# Patient Record
Sex: Female | Born: 1935 | Race: Black or African American | Hispanic: No | Marital: Married | Smoking: Former smoker
Health system: Southern US, Community
[De-identification: ages and names within clinical notes are randomized; demographics above are authoritative.]

## PROBLEM LIST (undated history)

## (undated) DIAGNOSIS — N390 Urinary tract infection, site not specified: Secondary | ICD-10-CM

## (undated) DIAGNOSIS — G319 Degenerative disease of nervous system, unspecified: Secondary | ICD-10-CM

## (undated) DIAGNOSIS — Z9181 History of falling: Secondary | ICD-10-CM

## (undated) DIAGNOSIS — R338 Other retention of urine: Secondary | ICD-10-CM

## (undated) DIAGNOSIS — M51369 Other intervertebral disc degeneration, lumbar region without mention of lumbar back pain or lower extremity pain: Secondary | ICD-10-CM

## (undated) DIAGNOSIS — M1120 Other chondrocalcinosis, unspecified site: Secondary | ICD-10-CM

## (undated) DIAGNOSIS — F0280 Dementia in other diseases classified elsewhere without behavioral disturbance: Secondary | ICD-10-CM

## (undated) DIAGNOSIS — E785 Hyperlipidemia, unspecified: Secondary | ICD-10-CM

## (undated) DIAGNOSIS — J309 Allergic rhinitis, unspecified: Secondary | ICD-10-CM

## (undated) DIAGNOSIS — M1991 Primary osteoarthritis, unspecified site: Secondary | ICD-10-CM

## (undated) DIAGNOSIS — R809 Proteinuria, unspecified: Secondary | ICD-10-CM

## (undated) DIAGNOSIS — Z2821 Immunization not carried out because of patient refusal: Secondary | ICD-10-CM

## (undated) DIAGNOSIS — M1990 Unspecified osteoarthritis, unspecified site: Secondary | ICD-10-CM

## (undated) DIAGNOSIS — Z86718 Personal history of other venous thrombosis and embolism: Secondary | ICD-10-CM

## (undated) DIAGNOSIS — I10 Essential (primary) hypertension: Secondary | ICD-10-CM

## (undated) DIAGNOSIS — G119 Hereditary ataxia, unspecified: Secondary | ICD-10-CM

## (undated) DIAGNOSIS — K5909 Other constipation: Secondary | ICD-10-CM

## (undated) DIAGNOSIS — K5730 Diverticulosis of large intestine without perforation or abscess without bleeding: Secondary | ICD-10-CM

## (undated) DIAGNOSIS — R29898 Other symptoms and signs involving the musculoskeletal system: Secondary | ICD-10-CM

## (undated) DIAGNOSIS — Z7901 Long term (current) use of anticoagulants: Secondary | ICD-10-CM

## (undated) DIAGNOSIS — F0150 Vascular dementia without behavioral disturbance: Secondary | ICD-10-CM

## (undated) DIAGNOSIS — E7800 Pure hypercholesterolemia, unspecified: Secondary | ICD-10-CM

## (undated) DIAGNOSIS — I739 Peripheral vascular disease, unspecified: Secondary | ICD-10-CM

## (undated) DIAGNOSIS — D509 Iron deficiency anemia, unspecified: Secondary | ICD-10-CM

## (undated) DIAGNOSIS — R4182 Altered mental status, unspecified: Secondary | ICD-10-CM

## (undated) DIAGNOSIS — K117 Disturbances of salivary secretion: Secondary | ICD-10-CM

## (undated) DIAGNOSIS — I952 Hypotension due to drugs: Secondary | ICD-10-CM

## (undated) DIAGNOSIS — M542 Cervicalgia: Secondary | ICD-10-CM

## (undated) DIAGNOSIS — I6529 Occlusion and stenosis of unspecified carotid artery: Secondary | ICD-10-CM

## (undated) DIAGNOSIS — E876 Hypokalemia: Secondary | ICD-10-CM

## (undated) DIAGNOSIS — K219 Gastro-esophageal reflux disease without esophagitis: Secondary | ICD-10-CM

## (undated) DIAGNOSIS — Z7409 Other reduced mobility: Secondary | ICD-10-CM

## (undated) DIAGNOSIS — R600 Localized edema: Secondary | ICD-10-CM

## (undated) DIAGNOSIS — N319 Neuromuscular dysfunction of bladder, unspecified: Secondary | ICD-10-CM

## (undated) DIAGNOSIS — R630 Anorexia: Secondary | ICD-10-CM

## (undated) DIAGNOSIS — R109 Unspecified abdominal pain: Secondary | ICD-10-CM

## (undated) DIAGNOSIS — M48061 Spinal stenosis, lumbar region without neurogenic claudication: Secondary | ICD-10-CM

## (undated) DIAGNOSIS — M5136 Other intervertebral disc degeneration, lumbar region: Secondary | ICD-10-CM

## (undated) DIAGNOSIS — R1319 Other dysphagia: Secondary | ICD-10-CM

## (undated) DIAGNOSIS — M109 Gout, unspecified: Secondary | ICD-10-CM

## (undated) DIAGNOSIS — R490 Dysphonia: Secondary | ICD-10-CM

## (undated) HISTORY — DX: History of falling: Z91.81

## (undated) HISTORY — DX: Other intervertebral disc degeneration, lumbar region: M51.36

## (undated) HISTORY — DX: Anorexia: R63.0

## (undated) HISTORY — DX: Other intervertebral disc degeneration, lumbar region without mention of lumbar back pain or lower extremity pain: M51.369

## (undated) HISTORY — DX: Other symptoms and signs involving the musculoskeletal system: R29.898

## (undated) HISTORY — DX: Proteinuria, unspecified: R80.9

## (undated) HISTORY — DX: Other retention of urine: R33.8

## (undated) HISTORY — DX: Neuromuscular dysfunction of bladder, unspecified: N31.9

## (undated) HISTORY — DX: Occlusion and stenosis of unspecified carotid artery: I65.29

## (undated) HISTORY — DX: Gout, unspecified: M10.9

## (undated) HISTORY — DX: Other chondrocalcinosis, unspecified site: M11.20

## (undated) HISTORY — DX: Hypotension due to drugs: I95.2

## (undated) HISTORY — DX: Personal history of other venous thrombosis and embolism: Z86.718

## (undated) HISTORY — DX: Unspecified osteoarthritis, unspecified site: M19.90

## (undated) HISTORY — DX: Vascular dementia without behavioral disturbance: F01.50

## (undated) HISTORY — DX: Peripheral vascular disease, unspecified: I73.9

## (undated) HISTORY — DX: Hereditary ataxia, unspecified: G11.9

## (undated) HISTORY — DX: Unspecified abdominal pain: R10.9

## (undated) HISTORY — DX: Cervicalgia: M54.2

## (undated) HISTORY — DX: Hypokalemia: E87.6

## (undated) HISTORY — DX: Essential (primary) hypertension: I10

## (undated) HISTORY — DX: Dysphonia: R49.0

## (undated) HISTORY — DX: Other constipation: K59.09

## (undated) HISTORY — DX: Disturbances of salivary secretion: K11.7

## (undated) HISTORY — DX: Altered mental status, unspecified: R41.82

## (undated) HISTORY — DX: Long term (current) use of anticoagulants: Z79.01

## (undated) HISTORY — DX: Allergic rhinitis, unspecified: J30.9

## (undated) HISTORY — DX: Diverticulosis of large intestine without perforation or abscess without bleeding: K57.30

## (undated) HISTORY — DX: Immunization not carried out because of patient refusal: Z28.21

## (undated) HISTORY — DX: Gastro-esophageal reflux disease without esophagitis: K21.9

## (undated) HISTORY — DX: Degenerative disease of nervous system, unspecified: G31.9

## (undated) HISTORY — DX: Vascular dementia, unspecified severity, without behavioral disturbance, psychotic disturbance, mood disturbance, and anxiety: F02.80

## (undated) HISTORY — DX: Iron deficiency anemia, unspecified: D50.9

## (undated) HISTORY — DX: Other reduced mobility: Z74.09

## (undated) HISTORY — DX: Urinary tract infection, site not specified: N39.0

## (undated) HISTORY — DX: Localized edema: R60.0

## (undated) HISTORY — DX: Primary osteoarthritis, unspecified site: M19.91

## (undated) HISTORY — DX: Other dysphagia: R13.19

## (undated) HISTORY — DX: Spinal stenosis, lumbar region without neurogenic claudication: M48.061

## (undated) HISTORY — DX: Hyperlipidemia, unspecified: E78.5

---

## 1982-05-08 DIAGNOSIS — Z87891 Personal history of nicotine dependence: Secondary | ICD-10-CM

## 1982-05-08 HISTORY — DX: Personal history of nicotine dependence: Z87.891

## 2012-10-12 DIAGNOSIS — N319 Neuromuscular dysfunction of bladder, unspecified: Secondary | ICD-10-CM | POA: Insufficient documentation

## 2012-10-12 DIAGNOSIS — M48061 Spinal stenosis, lumbar region without neurogenic claudication: Secondary | ICD-10-CM | POA: Insufficient documentation

## 2012-10-12 DIAGNOSIS — K219 Gastro-esophageal reflux disease without esophagitis: Secondary | ICD-10-CM | POA: Insufficient documentation

## 2012-10-12 DIAGNOSIS — M5137 Other intervertebral disc degeneration, lumbosacral region: Secondary | ICD-10-CM | POA: Insufficient documentation

## 2013-05-08 DIAGNOSIS — I2510 Atherosclerotic heart disease of native coronary artery without angina pectoris: Secondary | ICD-10-CM

## 2013-05-08 DIAGNOSIS — N183 Chronic kidney disease, stage 3 unspecified: Secondary | ICD-10-CM

## 2013-05-08 HISTORY — DX: Atherosclerotic heart disease of native coronary artery without angina pectoris: I25.10

## 2013-05-08 HISTORY — DX: Chronic kidney disease, stage 3 unspecified: N18.30

## 2013-09-04 DIAGNOSIS — K5730 Diverticulosis of large intestine without perforation or abscess without bleeding: Secondary | ICD-10-CM | POA: Insufficient documentation

## 2013-09-05 DIAGNOSIS — E785 Hyperlipidemia, unspecified: Secondary | ICD-10-CM | POA: Insufficient documentation

## 2013-12-01 DIAGNOSIS — I2510 Atherosclerotic heart disease of native coronary artery without angina pectoris: Secondary | ICD-10-CM | POA: Insufficient documentation

## 2013-12-01 DIAGNOSIS — M1990 Unspecified osteoarthritis, unspecified site: Secondary | ICD-10-CM | POA: Insufficient documentation

## 2013-12-01 DIAGNOSIS — M109 Gout, unspecified: Secondary | ICD-10-CM | POA: Insufficient documentation

## 2014-05-08 DIAGNOSIS — R471 Dysarthria and anarthria: Secondary | ICD-10-CM

## 2014-05-08 HISTORY — DX: Dysarthria and anarthria: R47.1

## 2014-07-18 ENCOUNTER — Emergency Department (HOSPITAL_BASED_OUTPATIENT_CLINIC_OR_DEPARTMENT_OTHER): Payer: Medicare Other

## 2014-07-18 ENCOUNTER — Emergency Department (HOSPITAL_BASED_OUTPATIENT_CLINIC_OR_DEPARTMENT_OTHER)
Admission: EM | Admit: 2014-07-18 | Discharge: 2014-07-18 | Disposition: A | Discharge status: Home / Self Care | Payer: Medicare Other | Attending: Emergency Medicine | Admitting: Emergency Medicine

## 2014-07-18 ENCOUNTER — Encounter (HOSPITAL_BASED_OUTPATIENT_CLINIC_OR_DEPARTMENT_OTHER): Payer: Self-pay

## 2014-07-18 DIAGNOSIS — S0083XA Contusion of other part of head, initial encounter: Secondary | ICD-10-CM | POA: Insufficient documentation

## 2014-07-18 DIAGNOSIS — W19XXXA Unspecified fall, initial encounter: Secondary | ICD-10-CM

## 2014-07-18 DIAGNOSIS — Z7901 Long term (current) use of anticoagulants: Secondary | ICD-10-CM | POA: Diagnosis not present

## 2014-07-18 DIAGNOSIS — Y9289 Other specified places as the place of occurrence of the external cause: Secondary | ICD-10-CM | POA: Diagnosis not present

## 2014-07-18 DIAGNOSIS — E780 Pure hypercholesterolemia: Secondary | ICD-10-CM | POA: Diagnosis not present

## 2014-07-18 DIAGNOSIS — T148XXA Other injury of unspecified body region, initial encounter: Secondary | ICD-10-CM

## 2014-07-18 DIAGNOSIS — S139XXA Sprain of joints and ligaments of unspecified parts of neck, initial encounter: Secondary | ICD-10-CM

## 2014-07-18 DIAGNOSIS — W01198A Fall on same level from slipping, tripping and stumbling with subsequent striking against other object, initial encounter: Secondary | ICD-10-CM | POA: Insufficient documentation

## 2014-07-18 DIAGNOSIS — S0990XA Unspecified injury of head, initial encounter: Secondary | ICD-10-CM | POA: Diagnosis present

## 2014-07-18 DIAGNOSIS — Z79899 Other long term (current) drug therapy: Secondary | ICD-10-CM | POA: Diagnosis not present

## 2014-07-18 DIAGNOSIS — I10 Essential (primary) hypertension: Secondary | ICD-10-CM | POA: Insufficient documentation

## 2014-07-18 DIAGNOSIS — Y998 Other external cause status: Secondary | ICD-10-CM | POA: Diagnosis not present

## 2014-07-18 DIAGNOSIS — S134XXA Sprain of ligaments of cervical spine, initial encounter: Secondary | ICD-10-CM | POA: Diagnosis not present

## 2014-07-18 DIAGNOSIS — Y9389 Activity, other specified: Secondary | ICD-10-CM | POA: Insufficient documentation

## 2014-07-18 HISTORY — DX: Essential (primary) hypertension: I10

## 2014-07-18 HISTORY — DX: Pure hypercholesterolemia, unspecified: E78.00

## 2014-07-18 MED ORDER — TRAMADOL HCL 50 MG PO TABS: 50.0000 mg | ORAL_TABLET | Freq: Four times a day (QID) | ORAL | Status: DC | PRN

## 2014-07-18 NOTE — ED Provider Notes (Signed)
CSN: [id number]     Arrival date & time 07/18/14  4098 History   First MD Initiated Contact with Patient 07/18/14 1024     Chief Complaint  Patient presents with  . Fall     (Consider location/radiation/quality/duration/timing/severity/associated sxs/prior Treatment) HPI Comments: PT comes in with cc of fall. Pt is on coumadin and has poor balance, she is seeing a neurologist.  Pt fell yday, getting out of her car and hit her head on the car door. She has been ambulating since then without pain. Pt has no mild pain in the back of her head and her neck, especially when she turns or lifts her head. No numbness, tingling. No hx of neck surgeries.   Patient is a 81 y.o. female presenting with fall. The history is provided by the patient.  Fall Associated symptoms include headaches.    Past Medical History  Diagnosis Date  . Hypertension   . High cholesterol    History reviewed. No pertinent past surgical history. No family history on file. History  Substance Use Topics  . Smoking status: Never Smoker   . Smokeless tobacco: Not on file  . Alcohol Use: Not on file   OB History    No data available     Review of Systems  Constitutional: Negative for activity change.  Musculoskeletal: Positive for neck pain.  Neurological: Positive for headaches. Negative for seizures, weakness and numbness.  Hematological: Bruises/bleeds easily.      Allergies  Review of patient's allergies indicates no known allergies.  Home Medications   Prior to Admission medications   Medication Sig Start Date End Date Taking? Authorizing Provider  atorvastatin (LIPITOR) 80 MG tablet Take 80 mg by mouth daily.   Yes Historical Provider, MD  benazepril (LOTENSIN) 40 MG tablet Take 40 mg by mouth daily.   Yes Historical Provider, MD  metoprolol tartrate (LOPRESSOR) 25 MG tablet Take 25 mg by mouth 2 (two) times daily.   Yes Historical Provider, MD  ranitidine (ZANTAC) 150 MG capsule Take 150 mg by  mouth 2 (two) times daily.   Yes Historical Provider, MD  traMADol (ULTRAM) 50 MG tablet Take 1 tablet (50 mg total) by mouth every 6 (six) hours as needed. 07/18/14   Derwood Kaplan, MD  warfarin (COUMADIN) 2 MG tablet Take 2 mg by mouth daily.   Yes Historical Provider, MD   BP 140/88 mmHg  Pulse 86  Temp(Src) 97.6 F (36.4 C) (Oral)  Resp 20  SpO2 96% Physical Exam  Constitutional: She is oriented to person, place, and time. She appears well-developed and well-nourished.  HENT:  Head: Normocephalic and atraumatic.  Hematoma on the occiput  Eyes: EOM are normal. Pupils are equal, round, and reactive to light.  Neck: Neck supple.  + generalized c-spine tenderness  Cardiovascular: Normal rate, regular rhythm and normal heart sounds.   No murmur heard. Pulmonary/Chest: Effort normal and breath sounds normal. No respiratory distress. She exhibits no tenderness.  Abdominal: Soft. Bowel sounds are normal. She exhibits no distension. There is no tenderness. There is no rebound and no guarding.  Musculoskeletal:  No long bone tenderness - upper and lower extrmeities and no pelvic pain, instability.  Neurological: She is alert and oriented to person, place, and time. No cranial nerve deficit.  Skin: Skin is warm and dry. No rash noted.  Nursing note and vitals reviewed.   ED Course  Procedures (including critical care time) Labs Review Labs Reviewed - No data to display  Imaging  Review Ct Head Wo Contrast  07/18/2014   CLINICAL DATA:  Fall.  Hit occipital region.  Headache.  EXAM: CT HEAD WITHOUT CONTRAST  TECHNIQUE: Contiguous axial images were obtained from the base of the skull through the vertex without intravenous contrast.  COMPARISON:  10/02/2008  FINDINGS: Prominence of the sulci and ventricles identified consistent with brain atrophy. Low-attenuation within the periventricular and subcortical white matter noted consistent with mild chronic microvascular disease. The midline is  maintained. No edema or mass effect identified. The paranasal sinuses are clear. The mastoid air cells appear clear. The calvarium is intact.  IMPRESSION: 1. No acute intracranial abnormalities. 2. Small vessel ischemic disease and brain atrophy.   Electronically Signed   By: Signa Kellaylor  Stroud M.D.   On: 07/18/2014 10:29   Ct Cervical Spine Wo Contrast  07/18/2014   CLINICAL DATA:  Fall yesterday.  Posterior neck pain  EXAM: CT CERVICAL SPINE WITHOUT CONTRAST  TECHNIQUE: Multidetector CT imaging of the cervical spine was performed without intravenous contrast. Multiplanar CT image reconstructions were also generated.  COMPARISON:  None.  FINDINGS: Normal alignment of the cervical spine. The vertebral body heights are well preserved. No evidence for cervical spine fracture. The facet joints appear aligned. Moderate to marked multi level disc space narrowing and ventral endplate spurring extends from C2 through C7.  IMPRESSION: 1. Cervical spondylosis. 2. No acute findings.   Electronically Signed   By: Signa Kellaylor  Stroud M.D.   On: 07/18/2014 12:45     EKG Interpretation None      MDM   Final diagnoses:  Cervical sprain, initial encounter  Fall, initial encounter  Hematoma    DDx includes: - Mechanical falls - ICH - Fractures - Contusions - Soft tissue injury  Pt with fall. CT head and CT c-spine ordered due to her having mild headache ,neck pain and being on coumadin with a hematoma.     Derwood KaplanAnkit Dyana Magner, MD 07/18/14 1329

## 2014-07-18 NOTE — Discharge Instructions (Signed)
We saw you in the ER after you had a fall. All the imaging results are normal, no fractures seen. No evidence of brain bleed. Please be very careful with walking, and do everything possible to prevent falls.   Cervical Sprain A cervical sprain is an injury in the neck in which the strong, fibrous tissues (ligaments) that connect your neck bones stretch or tear. Cervical sprains can range from mild to severe. Severe cervical sprains can cause the neck vertebrae to be unstable. This can lead to damage of the spinal cord and can result in serious nervous system problems. The amount of time it takes for a cervical sprain to get better depends on the cause and extent of the injury. Most cervical sprains heal in 1 to 3 weeks. CAUSES  Severe cervical sprains may be caused by:   Contact sport injuries (such as from football, rugby, wrestling, hockey, auto racing, gymnastics, diving, martial arts, or boxing).   Motor vehicle collisions.   Whiplash injuries. This is an injury from a sudden forward and backward whipping movement of the head and neck.  Falls.  Mild cervical sprains may be caused by:   Being in an awkward position, such as while cradling a telephone between your ear and shoulder.   Sitting in a chair that does not offer proper support.   Working at a poorly Marketing executivedesigned computer station.   Looking up or down for long periods of time.  SYMPTOMS   Pain, soreness, stiffness, or a burning sensation in the front, back, or sides of the neck. This discomfort may develop immediately after the injury or slowly, 24 hours or more after the injury.   Pain or tenderness directly in the middle of the back of the neck.   Shoulder or upper back pain.   Limited ability to move the neck.   Headache.   Dizziness.   Weakness, numbness, or tingling in the hands or arms.   Muscle spasms.   Difficulty swallowing or chewing.   Tenderness and swelling of the neck.  DIAGNOSIS    Most of the time your health care provider can diagnose a cervical sprain by taking your history and doing a physical exam. Your health care provider will ask about previous neck injuries and any known neck problems, such as arthritis in the neck. X-rays may be taken to find out if there are any other problems, such as with the bones of the neck. Other tests, such as a CT scan or MRI, may also be needed.  TREATMENT  Treatment depends on the severity of the cervical sprain. Mild sprains can be treated with rest, keeping the neck in place (immobilization), and pain medicines. Severe cervical sprains are immediately immobilized. Further treatment is done to help with pain, muscle spasms, and other symptoms and may include:  Medicines, such as pain relievers, numbing medicines, or muscle relaxants.   Physical therapy. This may involve stretching exercises, strengthening exercises, and posture training. Exercises and improved posture can help stabilize the neck, strengthen muscles, and help stop symptoms from returning.  HOME CARE INSTRUCTIONS   Put ice on the injured area.   Put ice in a plastic bag.   Place a towel between your skin and the bag.   Leave the ice on for 15-20 minutes, 3-4 times a day.   If your injury was severe, you may have been given a cervical collar to wear. A cervical collar is a two-piece collar designed to keep your neck from moving while  it heals.  Do not remove the collar unless instructed by your health care provider.  If you have long hair, keep it outside of the collar.  Ask your health care provider before making any adjustments to your collar. Minor adjustments may be required over time to improve comfort and reduce pressure on your chin or on the back of your head.  Ifyou are allowed to remove the collar for cleaning or bathing, follow your health care provider's instructions on how to do so safely.  Keep your collar clean by wiping it with mild soap  and water and drying it completely. If the collar you have been given includes removable pads, remove them every 1-2 days and hand wash them with soap and water. Allow them to air dry. They should be completely dry before you wear them in the collar.  If you are allowed to remove the collar for cleaning and bathing, wash and dry the skin of your neck. Check your skin for irritation or sores. If you see any, tell your health care provider.  Do not drive while wearing the collar.   Only take over-the-counter or prescription medicines for pain, discomfort, or fever as directed by your health care provider.   Keep all follow-up appointments as directed by your health care provider.   Keep all physical therapy appointments as directed by your health care provider.   Make any needed adjustments to your workstation to promote good posture.   Avoid positions and activities that make your symptoms worse.   Warm up and stretch before being active to help prevent problems.  SEEK MEDICAL CARE IF:   Your pain is not controlled with medicine.   You are unable to decrease your pain medicine over time as planned.   Your activity level is not improving as expected.  SEEK IMMEDIATE MEDICAL CARE IF:   You develop any bleeding.  You develop stomach upset.  You have signs of an allergic reaction to your medicine.   Your symptoms get worse.   You develop new, unexplained symptoms.   You have numbness, tingling, weakness, or paralysis in any part of your body.  MAKE SURE YOU:   Understand these instructions.  Will watch your condition.  Will get help right away if you are not doing well or get worse. Document Released: 02/19/2007 Document Revised: 04/29/2013 Document Reviewed: 10/30/2012 [facility] Patient Information 2015 [place], [place]. This information is not intended to replace advice given to you by your health care provider. Make sure you discuss any questions you have with  your health care provider.  Contusion A contusion is a deep bruise. Contusions happen when an injury causes bleeding under the skin. Signs of bruising include pain, puffiness (swelling), and discolored skin. The contusion may turn blue, purple, or yellow. HOME CARE   Put ice on the injured area.  Put ice in a plastic bag.  Place a towel between your skin and the bag.  Leave the ice on for 15-20 minutes, 03-04 times a day.  Only take medicine as told by your doctor.  Rest the injured area.  If possible, raise (elevate) the injured area to lessen puffiness. GET HELP RIGHT AWAY IF:   You have more bruising or puffiness.  You have pain that is getting worse.  Your puffiness or pain is not helped by medicine. MAKE SURE YOU:   Understand these instructions.  Will watch your condition.  Will get help right away if you are not doing well or get  worse. Document Released: 10/11/2007 Document Revised: 07/17/2011 Document Reviewed: 02/27/2011 [facility] Patient Information 2015 [place], [place]. This information is not intended to replace advice given to you by your health care provider. Make sure you discuss any questions you have with your health care provider.

## 2014-07-18 NOTE — ED Notes (Signed)
Patient here after falling against car door yesterday and hitting back of head against door. Then slid to ground and falling onto cement. No loc, no nausea, no blurred vision.  Alert and oriented, small hematoma to back of head.

## 2014-09-08 DIAGNOSIS — Z2821 Immunization not carried out because of patient refusal: Secondary | ICD-10-CM | POA: Insufficient documentation

## 2014-11-16 DIAGNOSIS — G119 Hereditary ataxia, unspecified: Secondary | ICD-10-CM | POA: Insufficient documentation

## 2015-02-17 DIAGNOSIS — R471 Dysarthria and anarthria: Secondary | ICD-10-CM | POA: Insufficient documentation

## 2015-05-09 DIAGNOSIS — Z7901 Long term (current) use of anticoagulants: Secondary | ICD-10-CM

## 2015-05-09 DIAGNOSIS — R791 Abnormal coagulation profile: Secondary | ICD-10-CM

## 2015-05-09 DIAGNOSIS — R7303 Prediabetes: Secondary | ICD-10-CM

## 2015-05-09 DIAGNOSIS — I6523 Occlusion and stenosis of bilateral carotid arteries: Secondary | ICD-10-CM

## 2015-05-09 DIAGNOSIS — Z5181 Encounter for therapeutic drug level monitoring: Secondary | ICD-10-CM

## 2015-05-09 HISTORY — DX: Long term (current) use of anticoagulants: Z79.01

## 2015-05-09 HISTORY — DX: Occlusion and stenosis of bilateral carotid arteries: I65.23

## 2015-05-09 HISTORY — DX: Prediabetes: R73.03

## 2015-05-09 HISTORY — DX: Encounter for therapeutic drug level monitoring: Z51.81

## 2015-05-09 HISTORY — DX: Abnormal coagulation profile: R79.1

## 2015-08-11 IMAGING — CT CT CERVICAL SPINE W/O CM
4 series · 15 of 33 positions shown, 18 images · non-contrast
Comparison: None.

CLINICAL DATA: Fall yesterday.  Posterior neck pain

EXAM:
CT CERVICAL SPINE WITHOUT CONTRAST
TECHNIQUE: Multidetector CT imaging of the cervical spine was performed without
intravenous contrast. Multiplanar CT image reconstructions were also
generated.

[Series 3: c_spine 2.0 b41s st · axial · 0.31mm/px · z∈[-179,-75]mm · 5 of 78 slices shown, 7 images]
[im 13/78  soft-tissue]
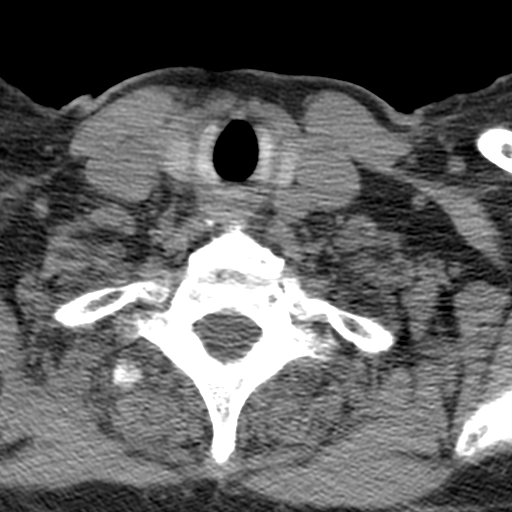
[im 13/78  bone]
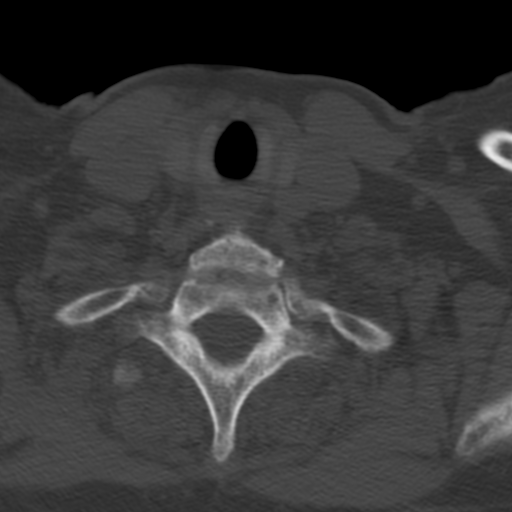
[im 26/78  bone]
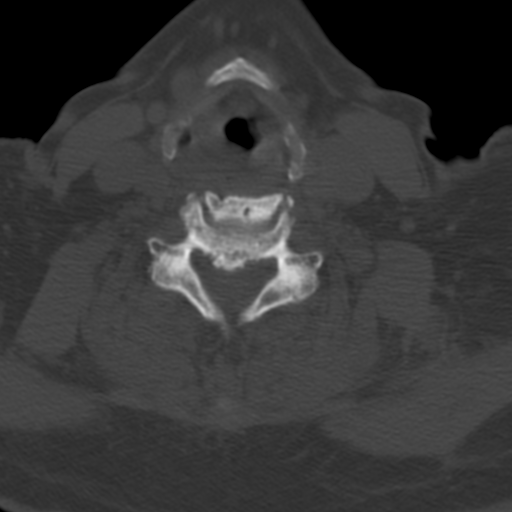
[im 39/78  bone]
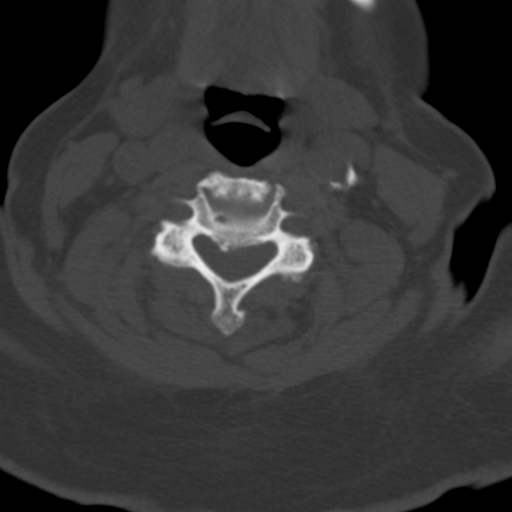
[im 52/78  bone]
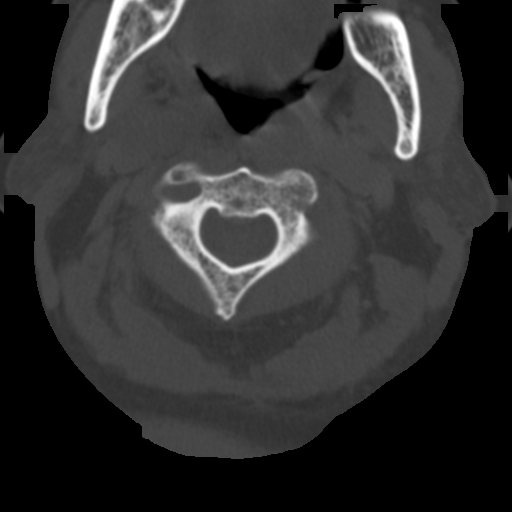
[im 65/78  soft-tissue]
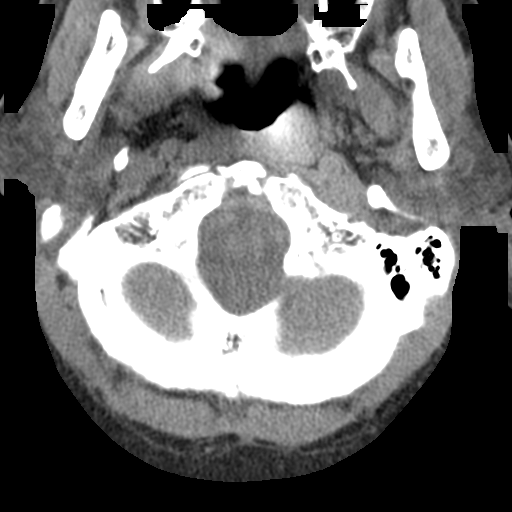
[im 65/78  bone]
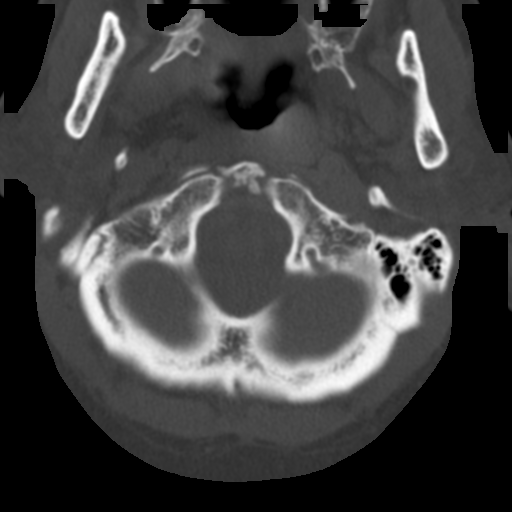

[Series 6: c_spine 2.0 coronal · coronal · 0.31mm/px · 3 of 45 slices shown]
[im 9/45  bone]
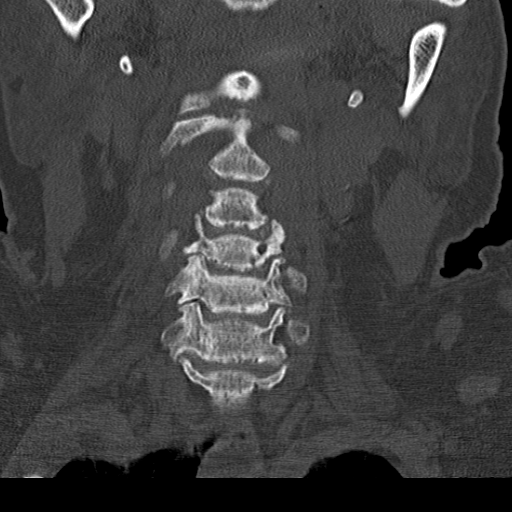
[im 18/45  bone]
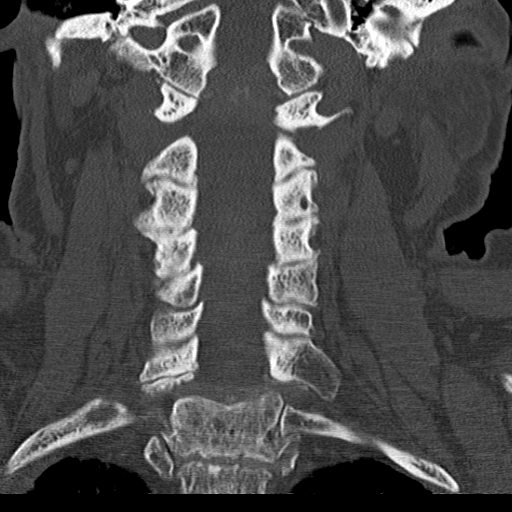
[im 27/45  bone]
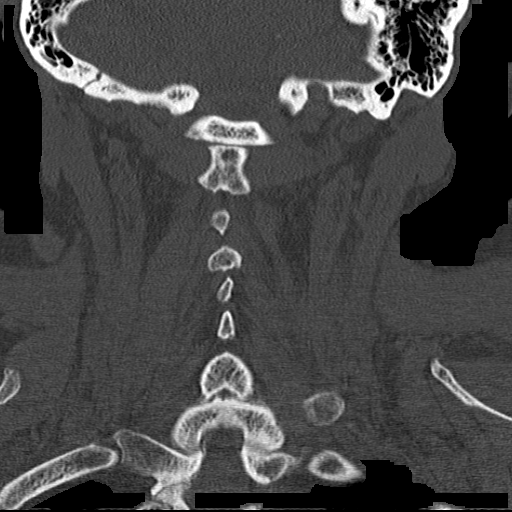

[Series 7: c_spine 2.0 sagittal · sagittal · 0.31mm/px · 5 of 38 slices shown, 6 images]
[im 13/38  bone]
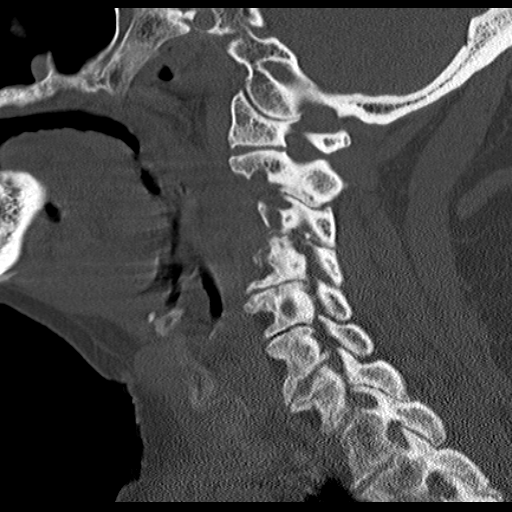
[im 16/38  bone]
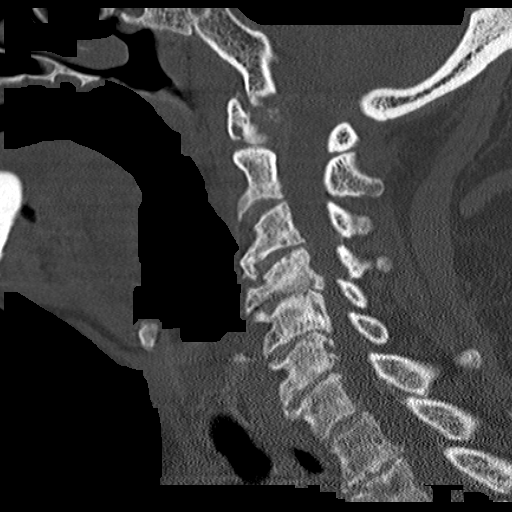
[im 19/38  soft-tissue]
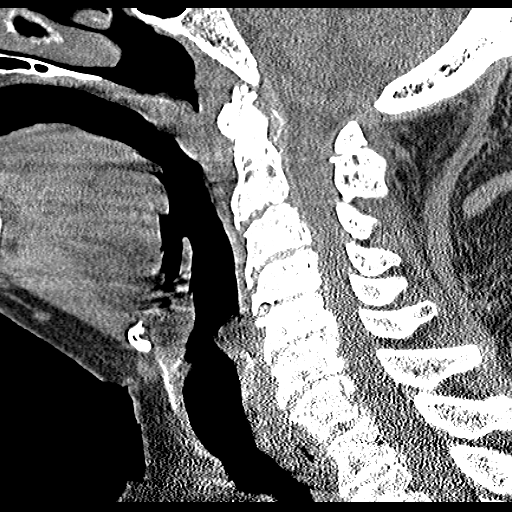
[im 19/38  bone]
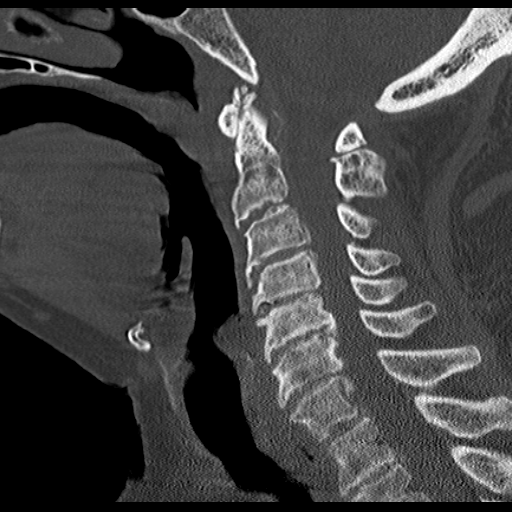
[im 22/38  bone]
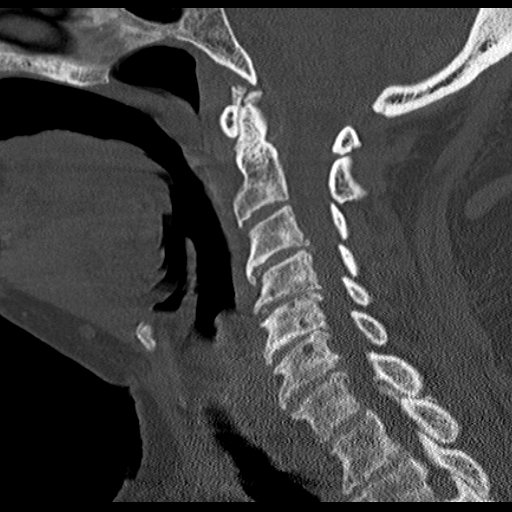
[im 25/38  bone]
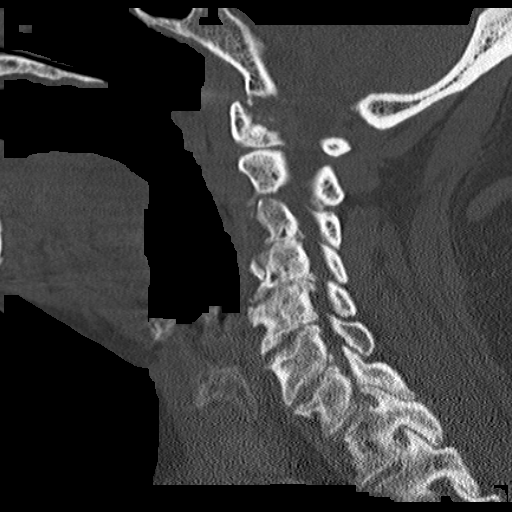

[Series 8: c_spine 2.0 orth ax · axial · 0.19mm/px · z∈[-197,-171]mm · 2 of 83 slices shown]
[im 14/83  bone]
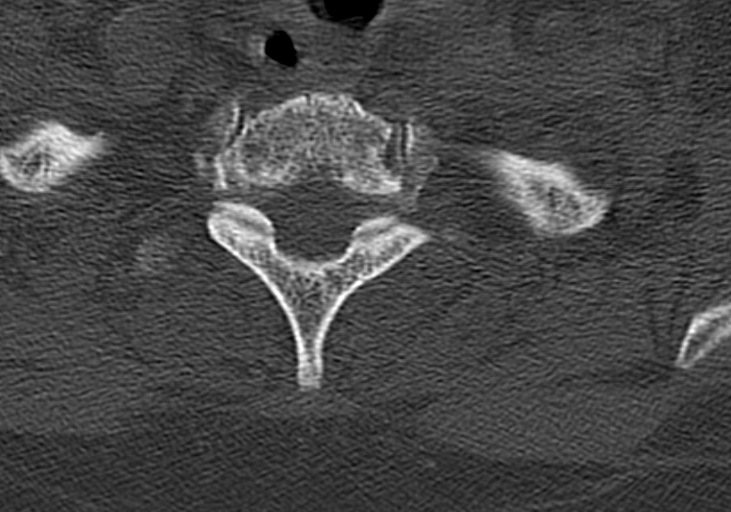
[im 28/83  bone]
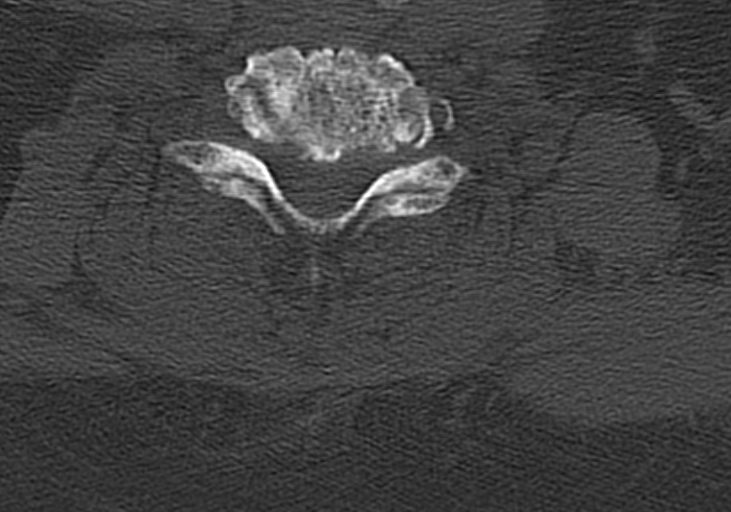

[15 of 33 positions shown; findings below may reference images not displayed]

FINDINGS: Normal alignment of the cervical spine. The vertebral body heights
are well preserved. No evidence for cervical spine fracture. The
facet joints appear aligned. Moderate to marked multi level disc
space narrowing and ventral endplate spurring extends from C2
through C7.
IMPRESSION: 1. Cervical spondylosis.
2. No acute findings.

## 2015-08-11 IMAGING — CT CT HEAD W/O CM
1 series · 16 of 30 positions shown, 20 images · non-contrast
Comparison: 10/02/2008

CLINICAL DATA: Fall.  Hit occipital region.  Headache.

EXAM:
CT HEAD WITHOUT CONTRAST
TECHNIQUE: Contiguous axial images were obtained from the base of the skull
through the vertex without intravenous contrast.

[Series 2: head 4.8 h37s · axial · 0.47mm/px · z∈[-131,+9]mm · 16 of 32 slices shown, 20 images]
[im 2/32  brain]
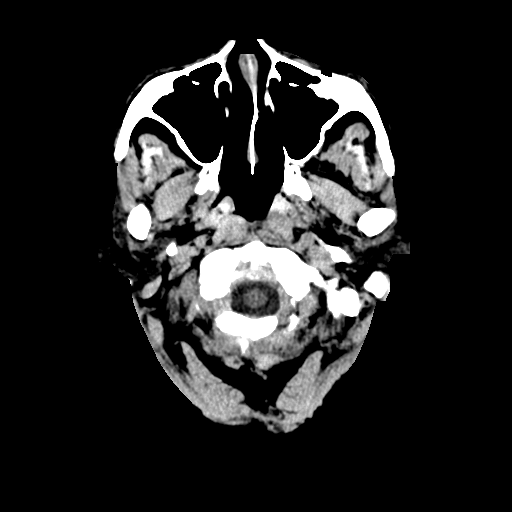
[im 2/32  bone]
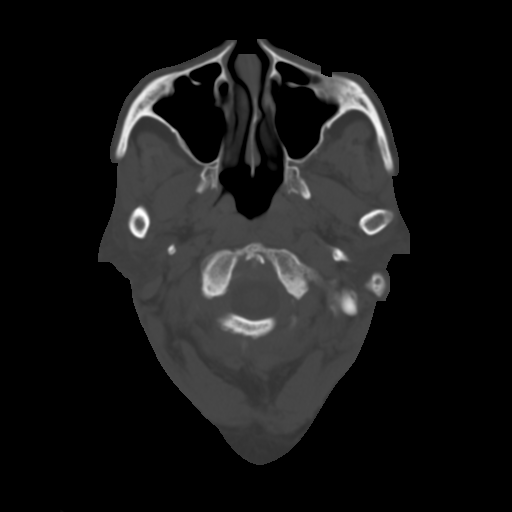
[im 4/32  brain]
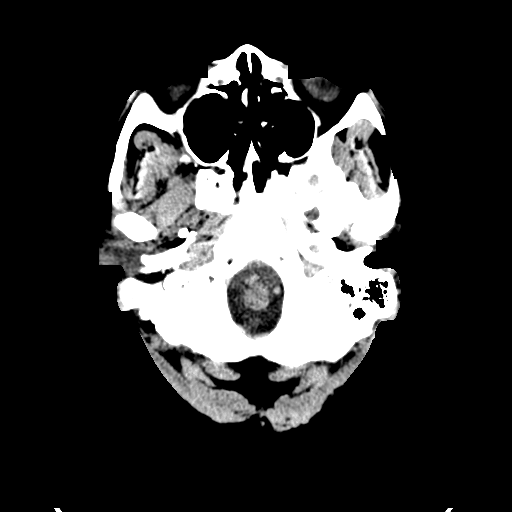
[im 6/32  brain]
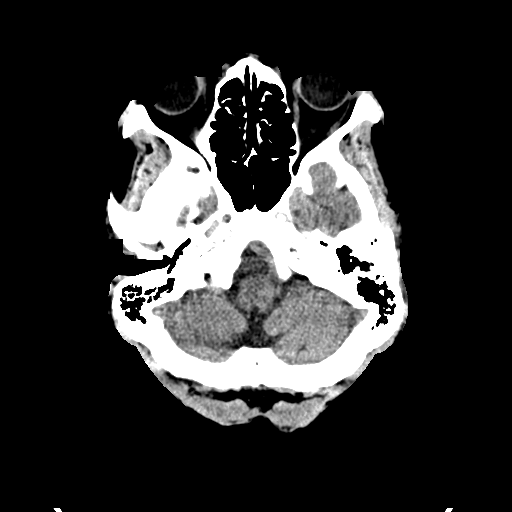
[im 8/32  brain]
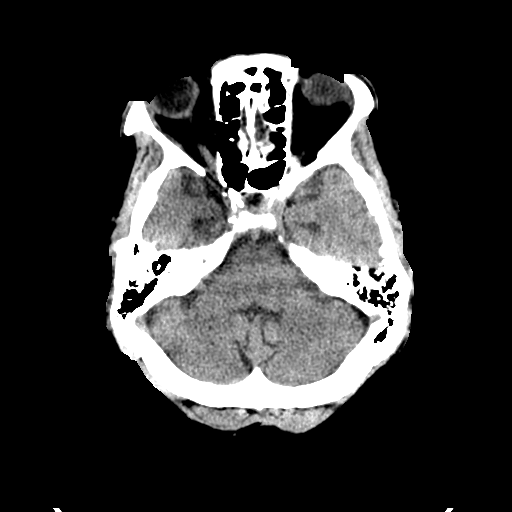
[im 9/32  brain]
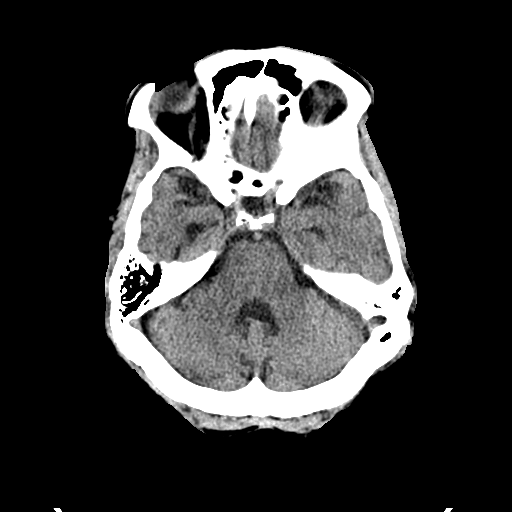
[im 9/32  bone]
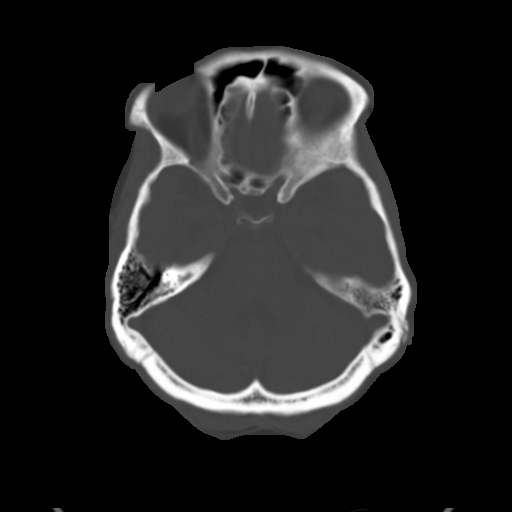
[im 11/32  brain]
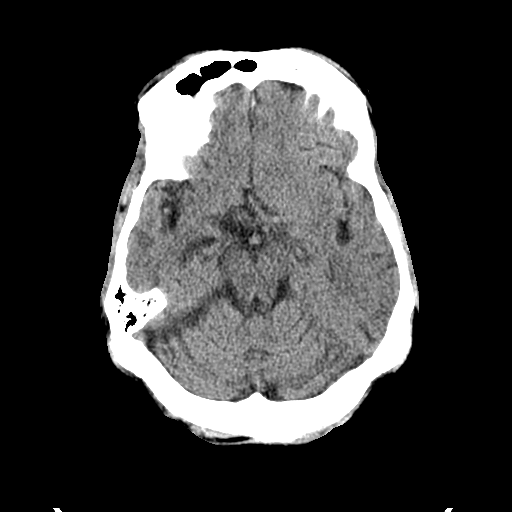
[im 13/32  brain]
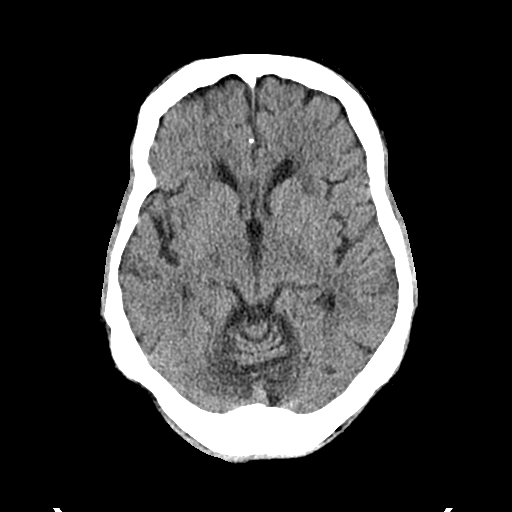
[im 15/32  brain]
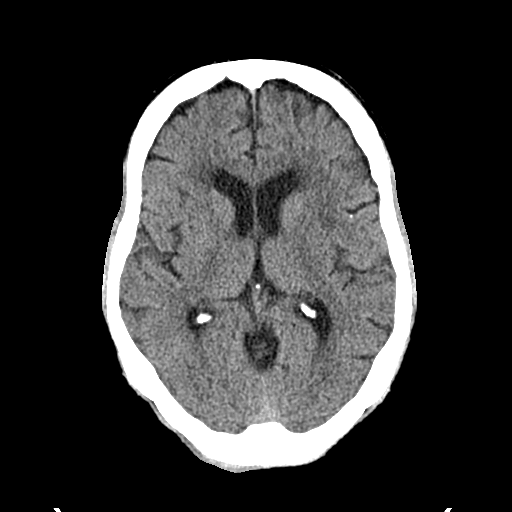
[im 17/32  brain]
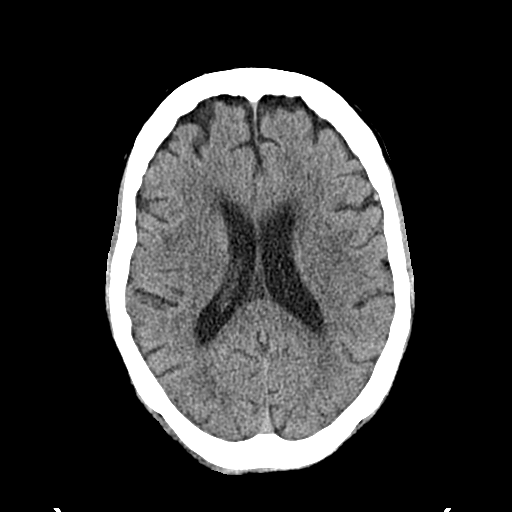
[im 17/32  bone]
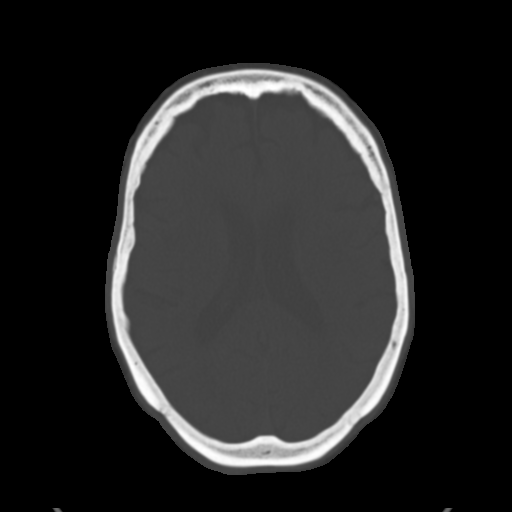
[im 19/32  brain]
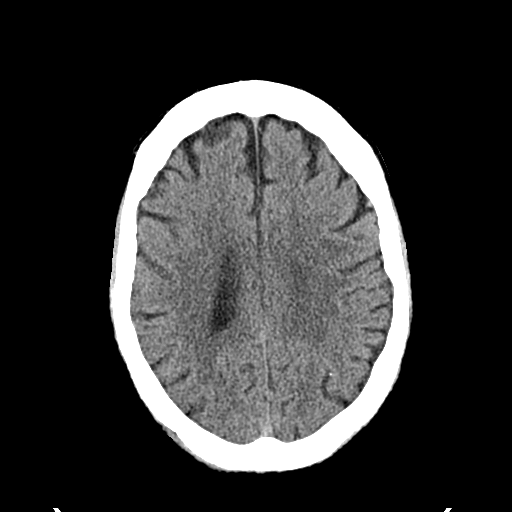
[im 21/32  brain]
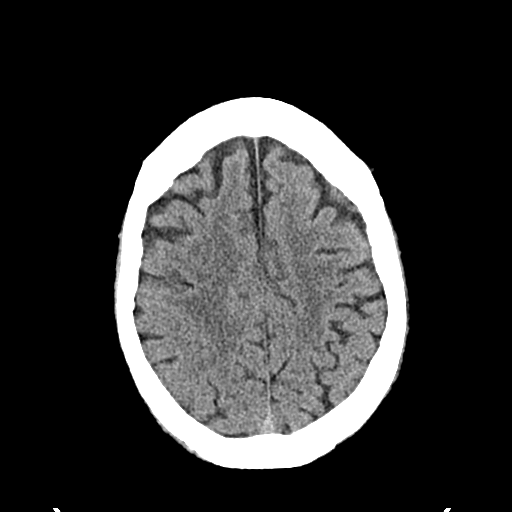
[im 23/32  brain]
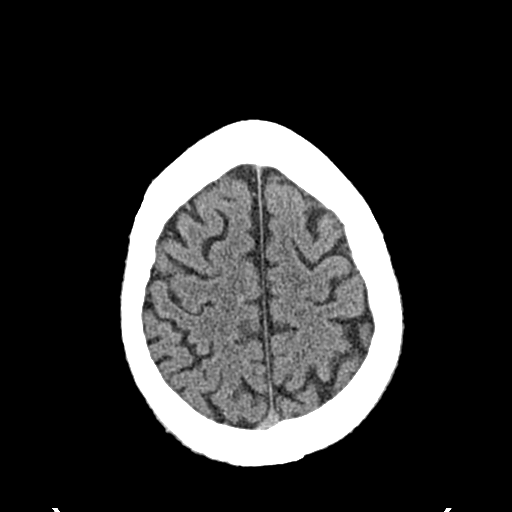
[im 24/32  brain]
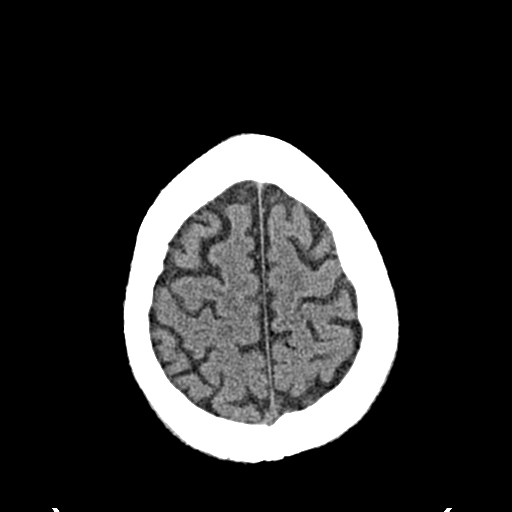
[im 24/32  bone]
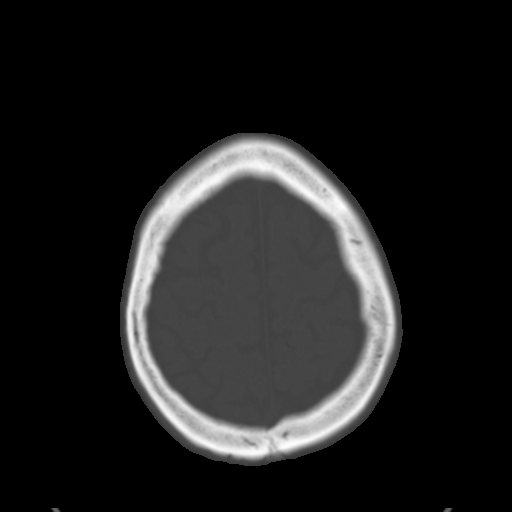
[im 26/32  brain]
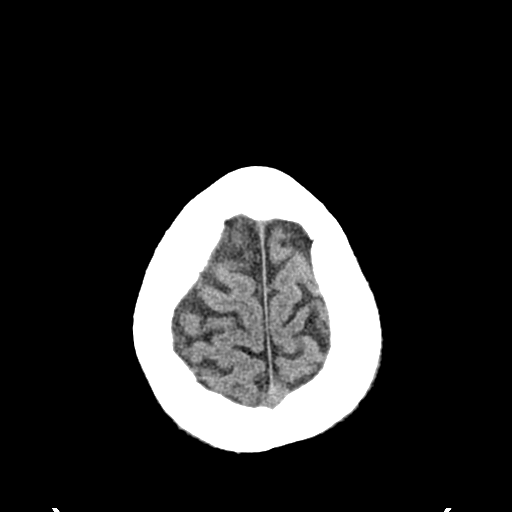
[im 28/32  brain]
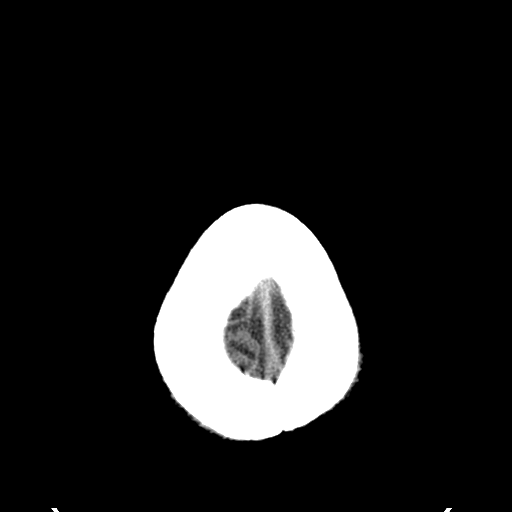
[im 30/32  brain]
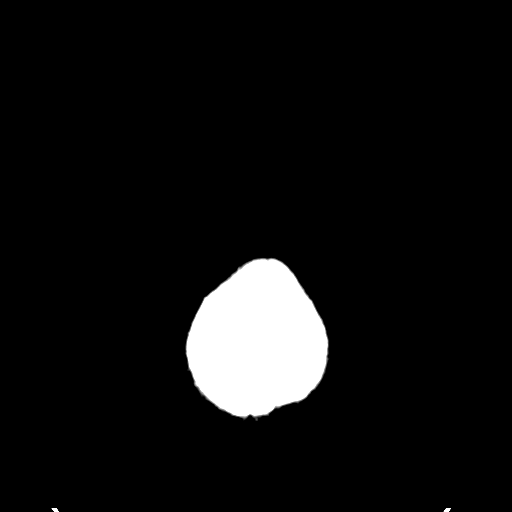

[16 of 30 positions shown; findings below may reference images not displayed]

FINDINGS: Prominence of the sulci and ventricles identified consistent with
brain atrophy. Low-attenuation within the periventricular and
subcortical white matter noted consistent with mild chronic
microvascular disease. The midline is maintained. No edema or mass
effect identified. The paranasal sinuses are clear. The mastoid air
cells appear clear. The calvarium is intact.
IMPRESSION: 1. No acute intracranial abnormalities.
2. Small vessel ischemic disease and brain atrophy.

## 2015-12-13 DIAGNOSIS — I70203 Unspecified atherosclerosis of native arteries of extremities, bilateral legs: Secondary | ICD-10-CM | POA: Insufficient documentation

## 2015-12-13 DIAGNOSIS — I6523 Occlusion and stenosis of bilateral carotid arteries: Secondary | ICD-10-CM | POA: Insufficient documentation

## 2016-01-22 DIAGNOSIS — K117 Disturbances of salivary secretion: Secondary | ICD-10-CM | POA: Insufficient documentation

## 2016-03-14 DIAGNOSIS — R7303 Prediabetes: Secondary | ICD-10-CM | POA: Insufficient documentation

## 2017-08-24 DIAGNOSIS — D509 Iron deficiency anemia, unspecified: Secondary | ICD-10-CM | POA: Insufficient documentation

## 2017-11-08 DIAGNOSIS — R1319 Other dysphagia: Secondary | ICD-10-CM | POA: Insufficient documentation

## 2018-04-12 DIAGNOSIS — R490 Dysphonia: Secondary | ICD-10-CM | POA: Insufficient documentation

## 2018-08-05 DIAGNOSIS — R630 Anorexia: Secondary | ICD-10-CM | POA: Insufficient documentation

## 2018-08-05 DIAGNOSIS — Z87891 Personal history of nicotine dependence: Secondary | ICD-10-CM | POA: Insufficient documentation

## 2019-04-28 DIAGNOSIS — M542 Cervicalgia: Secondary | ICD-10-CM | POA: Insufficient documentation

## 2019-07-21 DIAGNOSIS — R29898 Other symptoms and signs involving the musculoskeletal system: Secondary | ICD-10-CM | POA: Insufficient documentation

## 2019-07-21 DIAGNOSIS — Z86718 Personal history of other venous thrombosis and embolism: Secondary | ICD-10-CM | POA: Insufficient documentation

## 2019-09-02 DIAGNOSIS — M1712 Unilateral primary osteoarthritis, left knee: Secondary | ICD-10-CM | POA: Insufficient documentation

## 2019-10-23 DIAGNOSIS — F0150 Vascular dementia without behavioral disturbance: Secondary | ICD-10-CM | POA: Insufficient documentation

## 2019-11-15 LAB — COMPREHENSIVE METABOLIC PANEL
Albumin: 2.6 — AB (ref 3.5–5.0)
Calcium: 8.7 (ref 8.7–10.7)

## 2019-11-15 LAB — BASIC METABOLIC PANEL
BUN: 10 (ref 4–21)
CO2: 28 — AB (ref 13–22)
Chloride: 105 (ref 99–108)
Creatinine: 0.6 (ref 0.5–1.1)
Glucose: 139
Potassium: 4 (ref 3.4–5.3)
Sodium: 136 — AB (ref 137–147)

## 2019-11-15 LAB — CBC AND DIFFERENTIAL
HCT: 28 — AB (ref 36–46)
Hemoglobin: 9.9 — AB (ref 12.0–16.0)
Platelets: 288 (ref 150–399)
WBC: 8.2

## 2019-11-15 LAB — HEPATIC FUNCTION PANEL
ALT: 10 (ref 7–35)
AST: 15 (ref 13–35)

## 2019-11-15 LAB — CBC: RBC: 3.23 — AB (ref 3.87–5.11)

## 2019-11-25 ENCOUNTER — Non-Acute Institutional Stay (SKILLED_NURSING_FACILITY): Payer: Medicare Other | Admitting: Internal Medicine

## 2019-11-25 ENCOUNTER — Encounter: Payer: Self-pay | Admitting: Internal Medicine

## 2019-11-25 DIAGNOSIS — F0151 Vascular dementia with behavioral disturbance: Secondary | ICD-10-CM | POA: Diagnosis not present

## 2019-11-25 DIAGNOSIS — S065XAA Traumatic subdural hemorrhage with loss of consciousness status unknown, initial encounter: Secondary | ICD-10-CM

## 2019-11-25 DIAGNOSIS — N3090 Cystitis, unspecified without hematuria: Secondary | ICD-10-CM | POA: Diagnosis not present

## 2019-11-25 DIAGNOSIS — I739 Peripheral vascular disease, unspecified: Secondary | ICD-10-CM

## 2019-11-25 DIAGNOSIS — S065X9A Traumatic subdural hemorrhage with loss of consciousness of unspecified duration, initial encounter: Secondary | ICD-10-CM

## 2019-11-25 DIAGNOSIS — F01518 Vascular dementia, unspecified severity, with other behavioral disturbance: Secondary | ICD-10-CM

## 2019-11-25 DIAGNOSIS — D631 Anemia in chronic kidney disease: Secondary | ICD-10-CM

## 2019-11-25 DIAGNOSIS — N1831 Chronic kidney disease, stage 3a: Secondary | ICD-10-CM | POA: Insufficient documentation

## 2019-11-25 DIAGNOSIS — R339 Retention of urine, unspecified: Secondary | ICD-10-CM | POA: Diagnosis not present

## 2019-11-25 DIAGNOSIS — Z9181 History of falling: Secondary | ICD-10-CM

## 2019-11-25 DIAGNOSIS — M1120 Other chondrocalcinosis, unspecified site: Secondary | ICD-10-CM

## 2019-11-25 DIAGNOSIS — K5790 Diverticulosis of intestine, part unspecified, without perforation or abscess without bleeding: Secondary | ICD-10-CM

## 2019-11-25 DIAGNOSIS — D508 Other iron deficiency anemias: Secondary | ICD-10-CM

## 2019-11-25 DIAGNOSIS — D649 Anemia, unspecified: Secondary | ICD-10-CM | POA: Insufficient documentation

## 2019-11-25 DIAGNOSIS — I10 Essential (primary) hypertension: Secondary | ICD-10-CM

## 2019-11-25 NOTE — Progress Notes (Signed)
Provider:  Gwenith Spitz. Renato Gails, D.O., C.M.D. Location:  [facility] Room Number: 506 P Place of Service:  SNF (31)  PCP: Malka So., MD Patient Care Team: Malka So., MD as PCP - General (Internal Medicine)  Extended Emergency Contact Information Primary Emergency Contact: Thurman,Gibson Address: [address]          [place], [place] [address] [place] Home Phone: [phone] Mobile Phone: [phone] Relation: Spouse Secondary Emergency Contact: Bernita Raisin Mobile Phone: [phone] Relation: Son  Code Status: Full code Goals of Care: Advanced Directive information Advanced Directives 07/18/2014  Does Patient Have a Medical Advance Directive? No  Would patient like information on creating a medical advance directive? No - patient declined information   Chief Complaint  Patient presents with  . New Admit To SNF    New Admit     HPI: Patient is a 86 y.o. female seen today for admission to [facility] from [facility] after admission there 7/10-7/16/21 with recurrent urinary retention and urinary tract infection.  She has a history of dementia, frequent falls, subdural hematoma, hypertension, chronic kidney disease stage III, anemia, prior DVT, peripheral arterial disease status post right lower extremity graft, pseudogout, osteoarthritis, diverticulosis, and prior admission with urinary retention and bilateral hydronephrosis.  She had presented to the emergency room already on Flomax 0.4 mg daily and Foley catheter was placed in the emergency room.  She was advised to follow-up outpatient with urology.She had had a history of 2-3 UTIs within the past year.  She had received Keflex prior to obtaining cultures in the past due to being on suppressive antibiotics.  He had the abdomen on July 6th did not show hydronephrosis.  Thickening of her bladder was suspicious for cystitis.  He was discharged from the  hospital last time she was discharged with the Foley catheter and then followed up outpatient and wound up having the catheter removed on July 9.  She was then unable to void and return to the emergency department due to the urinary retention abdominal discomfort.  UA was consistent with UTI and she had been retaining 800 cc of urine.  She was treated with Cipro IV.  She comes here on the suppressive Keflex 250 mg p.o. daily.  Her anemia is due to chronic disease with normal iron B12 and folic acid earlier this year.  She is on an iron tablet twice a day.  She is no longer on Eliquis due to her subdural hematoma.  She remains on aspirin due to her peripheral arterial disease.  She was treated for diverticulitis with a small abscess in June as well.  She has had a prior stroke with dysarthria in addition to her dementia.  Her last stay was complicated by some delirium required IV Haldol.  She had both covid vaccines from [facility] 4/17 and 5/11.  When seen, her husband was present visiting (they have different last names).  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  She has a f/u with ENT in 3-4 mos,  She's not been eating well here per nursing.  She is to have a swallow test done.    Initially, she was fighting with staff during care and therapy, but did get up and get dressed today--was in wheelchair for our visit.    Since arriving, her catheter got clogged and had to be changed (  per on call provider).  She has it in place now with dark yellow urine.  She has an appt later this week with Dr. Merry Lofty, urologist re: urinary retention.   Histories below were obtained from her prior medical records through [facility] that had not been abstracted:   Past Medical History:  Diagnosis Date  . High cholesterol   . Hypertension   Past Medical History:  Diagnosis Date  . Arthritis  . Cancer (HCC)  larynx; vocal cord  . GERD  (gastroesophageal reflux disease)  . Gout  . Hyperlipidemia  . Hypertension  controlled with medications   Past Surgical History:  Procedure Laterality Date  . APPENDECTOMY  . BREAST BIOPSY Right  bg-years ago  . EYE SURGERY Bilateral  cataract  . HYSTERECTOMY  . laparoscopy partial colectomy with anastomosis  . LARYNGOSCOPY N/A 05/21/2018  Socioeconomic History  . Marital status: Married  Spouse name: Not on file  . Number of children: Not on file  . Years of education: Not on file  . Highest education level: Not on file  Occupational History  . Not on file  Tobacco Use  . Smoking status: Former Smoker  Packs/day: 1.00  Years: 30.00  Pack years: 30.00  Types: Cigarettes  Quit date: 1984  Years since quitting: 37.5  . Smokeless tobacco: Never Used  Procedure: Laryngoscopy direct, with endoscope, with excison of tumor; Surgeon: Elyn Peers., MD; Location: [facility]; Service: ENT; Laterality: N/A;   Socioeconomic History  . Marital status: Married  Spouse name: Not on file  . Number of children: Not on file  . Years of education: Not on file  . Highest education level: Not on file  Occupational History  . Not on file  Tobacco Use  . Smoking status: Former Smoker  Packs/day: 1.00  Years: 30.00  Pack years: 30.00  Types: Cigarettes  Quit date: 1984  Years since quitting: 37.5  . Smokeless tobacco: Never Used    Functional Status Survey:  requiring some help with all adls at this time  Family History: Family History  Problem Relation Age of Onset  . Breast cancer Sister  . Heart disease Mother  . Heart disease Father  . Cancer Brother  . Hypertension Other    Allergies  Allergen Reactions  . Ropinirole Other (See Comments)  . Tramadol Other (See Comments)    Confusion Confusion Confusion Confusion     Outpatient Encounter Medications as of 11/25/2019  Medication Sig  . allopurinol (ZYLOPRIM) 100 MG tablet Take 100 mg by  mouth daily.  Marland Kitchen atorvastatin (LIPITOR) 80 MG tablet Take 80 mg by mouth daily.  . benazepril (LOTENSIN) 40 MG tablet Take 40 mg by mouth daily.  . [EXPIRED] cephALEXin (KEFLEX) 500 MG capsule Take 500 mg by mouth 2 (two) times daily.  . furosemide (LASIX) 20 MG tablet Take 20 mg by mouth daily.  . metoprolol tartrate (LOPRESSOR) 25 MG tablet Take 25 mg by mouth 2 (two) times daily.  Marland Kitchen omeprazole (PRILOSEC) 40 MG capsule Take 40 mg by mouth daily.  Marland Kitchen POTASSIUM CHLORIDE ER PO Take 20 mEq by mouth.  . traZODone (DESYREL) 100 MG tablet Take 100 mg by mouth at bedtime.  . ranitidine (ZANTAC) 150 MG capsule Take 150 mg by mouth 2 (two) times daily.  . [DISCONTINUED] traMADol (ULTRAM) 50 MG tablet Take 1 tablet (50 mg total) by mouth every 6 (six) hours as needed.  . [DISCONTINUED] warfarin (COUMADIN) 2 MG tablet Take 2 mg  by mouth daily.   No facility-administered encounter medications on file as of 11/25/2019.    Review of Systems  Constitutional: Positive for malaise/fatigue and weight loss. Negative for chills and fever.  HENT: Negative for sore throat.        Hoarseness  Eyes: Negative for blurred vision.  Respiratory: Negative for cough and shortness of breath.   Cardiovascular: Negative for chest pain, palpitations and leg swelling.  Gastrointestinal: Negative for abdominal pain and constipation.  Genitourinary: Negative for dysuria.       Foley in place with dark yellow urine  Musculoskeletal: Positive for falls. Negative for joint pain.  Skin: Negative for itching and rash.  Neurological: Positive for speech change. Negative for dizziness and loss of consciousness.  Endo/Heme/Allergies: Does not bruise/bleed easily.  Psychiatric/Behavioral: Positive for memory loss. Negative for depression. The patient is not nervous/anxious and does not have insomnia.     Vitals:   11/25/19 1444  BP: 119/60  Pulse: 73  Temp: 98 F (36.7 C)  SpO2: 96%  Weight: 169 lb (76.7 kg)  Height: 5\' 5"   (1.651 m)   Body mass index is 28.12 kg/m. Physical Exam Vitals reviewed.  Constitutional:      General: She is not in acute distress.    Appearance: She is normal weight. She is not toxic-appearing.  HENT:     Head: Normocephalic and atraumatic.     Right Ear: External ear normal.     Left Ear: External ear normal.     Nose: Nose normal.     Mouth/Throat:     Pharynx: Oropharynx is clear.  Eyes:     Extraocular Movements: Extraocular movements intact.     Pupils: Pupils are equal, round, and reactive to light.  Cardiovascular:     Rate and Rhythm: Normal rate and regular rhythm.  Pulmonary:     Effort: Pulmonary effort is normal.     Breath sounds: Normal breath sounds. No wheezing, rhonchi or rales.  Abdominal:     General: Bowel sounds are normal.     Palpations: Abdomen is soft.     Tenderness: There is no abdominal tenderness.  Genitourinary:    Comments: Foley in place with dark yellow urine Musculoskeletal:        General: Normal range of motion.     Cervical back: Neck supple.     Right lower leg: No edema.     Left lower leg: No edema.  Lymphadenopathy:     Cervical: No cervical adenopathy.  Skin:    General: Skin is warm and dry.  Neurological:     General: No focal deficit present.     Mental Status: She is alert.     Motor: Weakness present.     Gait: Gait abnormal.     Comments: Using wheelchair   Psychiatric:        Mood and Affect: Mood normal.     Labs reviewed: From discharge summary in [facility] system again since not abstracted:   Significant Diagnostic Studies:  labs:  Recent Results (from the past 72 hour(s))  COVID Antigen  Specimen: Nasal Swab  Result Value Ref Range  COVID ANTIGEN Negative Negative  Technologist Smear Review  Result Value Ref Range  RBC Morphology  CBC and Differential  Result Value Ref Range  WBC 8.2 4.4 - 11.0 x 10*3/uL  RBC 3.23 (L) 4.10 - 5.10 x 10*6/uL  Hemoglobin 9.0 (L) 12.3 - 15.3 G/DL  Hematocrit 16.127.9 (L)  09.635.9 - 44.6 %  MCV 86.6 80.0 - 96.0 FL  MCH 27.8 27.5 - 33.2 PG  MCHC 32.1 (L) 33.0 - 37.0 G/DL  RDW 40.9 (H) 81.1 - 91.4 %  Platelets 288 150 - 450 X 10*3/uL  MPV 8.4 6.8 - 10.2 FL  Neutrophil % 64 %  Lymphocyte % 22 %  Monocyte % 9 %  Eosinophil % 5 %  Basophil % 1 %  Neutrophil Absolute 5.2 1.8 - 7.8 x 10*3/uL  Lymphocyte Absolute 1.8 1.0 - 4.8 x 10*3/uL  Monocyte Absolute 0.7 0.0 - 0.8 x 10*3/uL  Eosinophil Absolute 0.4 0.0 - 0.5 x 10*3/uL  Basophil Absolute 0.0 0.0 - 0.2 x 10*3/uL  CBC and Differential  Narrative  The following orders were created for panel order CBC and Differential. Procedure Abnormality Status  --------- ----------- ------  CBC and Differential[[id number]] Abnormal Final result  Technologist Smear Review[[id number]] Final result   Please view results for these tests on the individual orders.  Comprehensive Metabolic Panel  Result Value Ref Range  Sodium 136 135 - 146 MMOL/L  Potassium 4.0 3.5 - 5.3 MMOL/L  Chloride 105 98 - 110 MMOL/L  CO2 28 23 - 30 MMOL/L  BUN 10 8 - 24 MG/DL  Glucose 782 (H) 70 - 99 MG/DL  Creatinine 9.56 2.13 - 1.50 MG/DL  Calcium 8.7 8.5 - 08.6 MG/DL  Total Protein 5.6 (L) 6.0 - 8.3 G/DL  Albumin 2.6 (L) 3.5 - 5.0 G/DL  Total Bilirubin 0.5 0.1 - 1.2 MG/DL  Alkaline Phosphatase 81 25 - 125 IU/L or U/L  AST (SGOT) 15 5 - 40 IU/L or U/L  ALT (SGPT) 10 5 - 50 IU/L or U/L  Anion Gap 3 (L) 4 - 14 MMOL/L  Est. GFR African American >=90 >=60 ML/MIN/1.73 M*2  Technologist Smear Review  Result Value Ref Range  RBC Morphology  CBC and Differential  Result Value Ref Range  WBC 7.6 4.4 - 11.0 x 10*3/uL  RBC 3.50 (L) 4.10 - 5.10 x 10*6/uL  Hemoglobin 9.9 (L) 12.3 - 15.3 G/DL  Hematocrit 57.8 (L) 46.9 - 44.6 %  MCV 87.0 80.0 - 96.0 FL  MCH 28.2 27.5 - 33.2 PG  MCHC 32.4 (L) 33.0 - 37.0 G/DL  RDW 62.9 (H) 52.8 - 41.3 %  Platelets 346 150 - 450 X 10*3/uL  MPV 8.3 6.8 - 10.2 FL  Neutrophil % 64 %  Lymphocyte % 22 %  Monocyte % 9  %  Eosinophil % 4 %  Basophil % 1 %  Neutrophil Absolute 4.9 1.8 - 7.8 x 10*3/uL  Lymphocyte Absolute 1.7 1.0 - 4.8 x 10*3/uL  Monocyte Absolute 0.7 0.0 - 0.8 x 10*3/uL  Eosinophil Absolute 0.3 0.0 - 0.5 x 10*3/uL  Basophil Absolute 0.1 0.0 - 0.2 x 10*3/uL  Basic Metabolic Panel  Result Value Ref Range  Sodium 136 135 - 146 MMOL/L  Potassium 3.9 3.5 - 5.3 MMOL/L  Chloride 104 98 - 110 MMOL/L  CO2 27 23 - 30 MMOL/L  BUN 9 8 - 24 MG/DL  Glucose 244 (H) 70 - 99 MG/DL  Creatinine 0.10 2.72 - 1.50 MG/DL  Calcium 8.5 8.5 - 53.6 MG/DL  Anion Gap 5 4 - 14 MMOL/L  Est. GFR African American >=90 >=60 ML/MIN/1.73 M*2   Imaging and Procedures obtained prior to SNF admission: CT Head Wo Contrast  Result Date: 07/18/2014 CLINICAL DATA:  Fall.  Hit occipital region.  Headache. EXAM: CT HEAD WITHOUT CONTRAST TECHNIQUE: Contiguous axial images were obtained from the base of the  skull through the vertex without intravenous contrast. COMPARISON:  10/02/2008 FINDINGS: Prominence of the sulci and ventricles identified consistent with brain atrophy. Low-attenuation within the periventricular and subcortical white matter noted consistent with mild chronic microvascular disease. The midline is maintained. No edema or mass effect identified. The paranasal sinuses are clear. The mastoid air cells appear clear. The calvarium is intact. IMPRESSION: 1. No acute intracranial abnormalities. 2. Small vessel ischemic disease and brain atrophy. Electronically Signed   By: Signa Kell M.D.   On: 07/18/2014 10:29   CT Cervical Spine Wo Contrast  Result Date: 07/18/2014 CLINICAL DATA:  Fall yesterday.  Posterior neck pain EXAM: CT CERVICAL SPINE WITHOUT CONTRAST TECHNIQUE: Multidetector CT imaging of the cervical spine was performed without intravenous contrast. Multiplanar CT image reconstructions were also generated. COMPARISON:  None. FINDINGS: Normal alignment of the cervical spine. The vertebral body heights are well  preserved. No evidence for cervical spine fracture. The facet joints appear aligned. Moderate to marked multi level disc space narrowing and ventral endplate spurring extends from C2 through C7. IMPRESSION: 1. Cervical spondylosis. 2. No acute findings. Electronically Signed   By: Signa Kell M.D.   On: 07/18/2014 12:45    Assessment/Plan 1. Urinary retention -persists, has foley in place, f/u with urology fri as planned  2. Recurrent cystitis -completes course of keflex on 7/21  3. Vascular dementia with behavior disturbance (HCC) -having some behaviors with care since arrival, did better this morning as she adjusts, not on meds for this and would prefer to use nonpharmacologic approaches  4. Subdural hematoma (HCC) -no longer on warfarin due to this  5. H/O fall -with subdural -here for PT, OT, ST  6. Stage 3a chronic kidney disease -Avoid nephrotoxic agents like nsaids, dose adjust renally excreted meds, hydrate.  7. Diverticulosis -notable, ensure adequate fiber in diet to prevent infection, has just standing orders for bowel regimen at this time  8. Pseudogout -no longer on regular pain medication, had some tramadol short-term for pain from her fall, monitor for erythema, warmth, swelling of joints -takes allopurinol  9. Anemia due to stage 3a chronic kidney disease -f/u cbc  10. Other iron deficiency anemia -f/u cbc, due to #9  11. PAD (peripheral artery disease) (HCC) -avoid pressure injury as best possible with regular positional changes, adequate nutrition, moisturizing skin, avoiding excess edema   12. Benign essential HTN -bp at goal with benazepril and lasix  Family/ staff Communication:  Discussed with snf nurse and resident's husband  Labs/tests ordered:  Cbc, bmp   Niza Soderholm L. Kashaun Bebo, D.O. Geriatrics [facility] [address], [place] [address] Cell Phone (Mon-Fri 8am-5pm):  [phone] On Call:  [phone]  & follow prompts after 5pm & weekends Office Phone:  [phone] Office Fax:  [phone]

## 2019-11-28 LAB — BASIC METABOLIC PANEL
BUN: 10 (ref 4–21)
CO2: 21 (ref 13–22)
Chloride: 104 (ref 99–108)
Creatinine: 0.7 (ref 0.5–1.1)
Glucose: 90
Potassium: 4.4 (ref 3.4–5.3)
Sodium: 137 (ref 137–147)

## 2019-11-28 LAB — COMPREHENSIVE METABOLIC PANEL
Calcium: 9 (ref 8.7–10.7)
GFR calc Af Amer: >90
GFR calc non Af Amer: 80.31

## 2019-11-28 LAB — CBC AND DIFFERENTIAL
HCT: 29 — AB (ref 36–46)
Hemoglobin: 9.5 — AB (ref 12.0–16.0)
Platelets: 292 (ref 150–399)
WBC: 6.8

## 2019-11-28 LAB — CBC: RBC: 3.3 — AB (ref 3.87–5.11)

## 2019-11-29 ENCOUNTER — Encounter: Payer: Self-pay | Admitting: Internal Medicine

## 2019-11-29 DIAGNOSIS — Z7409 Other reduced mobility: Secondary | ICD-10-CM | POA: Insufficient documentation

## 2019-12-01 ENCOUNTER — Encounter: Payer: Self-pay | Admitting: Internal Medicine

## 2019-12-17 ENCOUNTER — Encounter: Payer: Self-pay | Admitting: Family

## 2019-12-17 ENCOUNTER — Non-Acute Institutional Stay (SKILLED_NURSING_FACILITY): Payer: Medicare Other | Admitting: Family

## 2019-12-17 DIAGNOSIS — R339 Retention of urine, unspecified: Secondary | ICD-10-CM

## 2019-12-17 DIAGNOSIS — F0151 Vascular dementia with behavioral disturbance: Secondary | ICD-10-CM

## 2019-12-17 DIAGNOSIS — S065X9A Traumatic subdural hemorrhage with loss of consciousness of unspecified duration, initial encounter: Secondary | ICD-10-CM

## 2019-12-17 DIAGNOSIS — I739 Peripheral vascular disease, unspecified: Secondary | ICD-10-CM

## 2019-12-17 DIAGNOSIS — Z9181 History of falling: Secondary | ICD-10-CM

## 2019-12-17 DIAGNOSIS — N1831 Chronic kidney disease, stage 3a: Secondary | ICD-10-CM | POA: Diagnosis not present

## 2019-12-17 DIAGNOSIS — F01518 Vascular dementia, unspecified severity, with other behavioral disturbance: Secondary | ICD-10-CM

## 2019-12-17 DIAGNOSIS — M1120 Other chondrocalcinosis, unspecified site: Secondary | ICD-10-CM

## 2019-12-17 DIAGNOSIS — S065XAA Traumatic subdural hemorrhage with loss of consciousness status unknown, initial encounter: Secondary | ICD-10-CM

## 2019-12-17 DIAGNOSIS — R2681 Unsteadiness on feet: Secondary | ICD-10-CM

## 2019-12-17 DIAGNOSIS — N3090 Cystitis, unspecified without hematuria: Secondary | ICD-10-CM

## 2019-12-17 DIAGNOSIS — K219 Gastro-esophageal reflux disease without esophagitis: Secondary | ICD-10-CM

## 2019-12-17 DIAGNOSIS — I10 Essential (primary) hypertension: Secondary | ICD-10-CM

## 2019-12-17 DIAGNOSIS — D631 Anemia in chronic kidney disease: Secondary | ICD-10-CM

## 2019-12-17 DIAGNOSIS — K117 Disturbances of salivary secretion: Secondary | ICD-10-CM

## 2019-12-17 NOTE — Progress Notes (Signed)
Location:  [facility] Room Number: 506-P Place of Service:  SNF [phone])  Provider: Richarda Blade FNP-C   PCP: Malka So., MD Patient Care Team: Malka So., MD as PCP - General (Internal Medicine) Andreas Blower., MD as Rounding Team (Internal Medicine) Nancy Fetter, RD as Rounding Team (Nutrition) Stoneking, Danford Bad., MD as Rounding Team (Urology)  Extended Emergency Contact Information Primary Emergency Contact: Thurman,Gibson Address: [address]          [place], [place] [address] [place] Home Phone: [phone] Mobile Phone: [phone] Relation: Spouse Secondary Emergency Contact: Bernita Raisin Mobile Phone: [phone] Relation: Son  Code Status: Full Code  Goals of care:  Advanced Directive information Advanced Directives 12/17/2019  Does Patient Have a Medical Advance Directive? Yes  Type of Advance Directive Healthcare Power of Attorney  Does patient want to make changes to medical advance directive? No - Patient declined  Copy of Healthcare Power of Attorney in Chart? No - copy requested  Would patient like information on creating a medical advance directive? -     Allergies  Allergen Reactions  . Ropinirole Other (See Comments)  . Tramadol Other (See Comments)    Confusion Confusion Confusion Confusion     Chief Complaint  Patient presents with  . Discharge Note    Discharge from SNF to home with family support.    HPI:  86 y.o. female seen today at [facility] for discharge home with family support.she has a medical history of Hypertension,Chronic Kidney disease stage 3,Peripheral arterial disease s/p right lower extremity graft,Hx of DVT,Anemia ,Dementia,subdural Hematoma,Pseudogout,Osteoarthritis,diverticulosis,hx of urine retention and bilateral hydronephrosis among other conditions.she was here for short term rehabilitation for post hospital admission 11/15/2019 - 11/21/2019 for  recurrent urinary retention and urinary tract infection.foley catheter was placed in the ED.Her U/a was consistent with UTI.She was treated with I.V Cipro then discharged on Keflex 250 mg tablet daily.she has had a both COVID-19 vaccine.Per Nurse patient pulled foley catheter and has been voiding without any difficulties.during her stay here at Rehab she was uncooperative and fighting staff but has improved.she is seen sitting on her wheelchair today with POA at bedside.she denies any acute issues.    Has worked well with PT/OT now stable for discharge home.She will be discharged home with Home health PT/OT to continue with ROM, Exercise, Gait stability and muscle strengthening. She does not require any new DME POA  States patient has a hospital bed,wheelchir,Bedside commode and a walker at home.She will also require a Home health Nurse for wound care management.Has unstageable ulcer on right heel. Home health services will be arranged by facility social worker prior to discharge. Prescription medication will be written x 1 month then patient to follow up with PCP in 1-2 weeks.Patient's husband request medication to be send to [facility] at [address] [place] in [place] Tel # [phone] but then states would like to go home and check what medication has run out then notify facility whether to send to a local pharmacy since it takes several weeks sometimes for [facility] to deliver medication. Facility staff report no new concerns.   Past Medical History:  Diagnosis Date  . Abdominal pain   . Abnormal INR 2017  . Acute urinary retention   . Allergic rhinitis   . AMS (altered mental status)   . Anorexia   . At high risk for falls   . Bilateral carotid artery  stenosis 2017  . Bilateral lower extremity edema   . Carotid atherosclerosis   . Cerebellar ataxia (HCC)   . Cerebellar degeneration (HCC)   . Cerebellar dysarthria 2016  . Cervicalgia   . Chronic anticoagulation   . CKD (chronic kidney  disease), stage III 2015   Moderate  . Coronary artery disease involving native coronary artery without angina pectoris 2015  . Degeneration of lumbar intervertebral disc   . Diverticulosis of colon   . Drooling   . Dyslipidemia   . Dysphagia, neurologic   . Dysphonia   . Encounter for monitoring Coumadin therapy 2017  . Esophageal reflux   . Essential hypertension   . Former smoker 221984  . GERD (gastroesophageal reflux disease)   . Gout   . High cholesterol   . History of DVT (deep vein thrombosis)   . Hypertension   . Hypokalemia   . Hypotension due to drugs   . Impaired functional mobility, balance, gait, and endurance   . Iron deficiency anemia   . Long term current use of anticoagulant therapy   . Mixed Alzheimer's and vascular dementia (HCC)   . Neurogenic bladder   . Osteoarthritis   . Other constipation   . Peripheral vascular disease (HCC)   . Prediabetes 2017  . Primary osteoarthritis   . Proteinuria   . Pseudogout   . Refuses tetanus, diphtheria, and acellular pertussis (Tdap) vaccination   . Small vessel disease (HCC)   . Spinal stenosis of lumbar region   . UTI (urinary tract infection)   . Weakness of both legs   . Weakness of both upper extremities     History reviewed. No pertinent surgical history.    reports that she has quit smoking. She has a 30.00 pack-year smoking history. She has never used smokeless tobacco. She reports previous alcohol use. She reports previous drug use. Social History   Socioeconomic History  . Marital status: Married    Spouse name: Teena IraniGibson Thurman   . Number of children: 2  . Years of education: Not on file  . Highest education level: Not on file  Occupational History  . Not on file  Tobacco Use  . Smoking status: Former Smoker    Packs/day: 1.00    Years: 30.00    Pack years: 30.00  . Smokeless tobacco: Never Used  Substance and Sexual Activity  . Alcohol use: Not Currently  . Drug use: Not Currently  . Sexual  activity: Not Currently  Other Topics Concern  . Not on file  Social History Narrative  . Not on file   Social Determinants of Health   Financial Resource Strain:   . Difficulty of Paying Living Expenses:   Food Insecurity:   . Worried About Programme researcher, broadcasting/film/videounning Out of Food in the Last Year:   . Baristaan Out of Food in the Last Year:   Transportation Needs:   . Freight forwarderLack of Transportation (Medical):   Marland Kitchen. Lack of Transportation (Non-Medical):   Physical Activity:   . Days of Exercise per Week:   . Minutes of Exercise per Session:   Stress:   . Feeling of Stress :   Social Connections:   . Frequency of Communication with Friends and Family:   . Frequency of Social Gatherings with Friends and Family:   . Attends Religious Services:   . Active Member of Clubs or Organizations:   . Attends BankerClub or Organization Meetings:   Marland Kitchen. Marital Status:   Intimate Partner Violence:   .  Fear of Current or Ex-Partner:   . Emotionally Abused:   Marland Kitchen Physically Abused:   . Sexually Abused:     Allergies  Allergen Reactions  . Ropinirole Other (See Comments)  . Tramadol Other (See Comments)    Confusion Confusion Confusion Confusion     Pertinent  Health Maintenance Due  Topic Date Due  . DEXA SCAN  Never done  . INFLUENZA VACCINE  12/07/2019  . PNA vac Low Risk Adult  Completed    Medications: Outpatient Encounter Medications as of 12/17/2019  Medication Sig  . allopurinol (ZYLOPRIM) 100 MG tablet Take 100 mg by mouth daily.  Marland Kitchen amitriptyline (ELAVIL) 25 MG tablet Take 25 mg by mouth at bedtime.  Marland Kitchen amLODipine (NORVASC) 10 MG tablet Take 10 mg by mouth daily.  Marland Kitchen atorvastatin (LIPITOR) 80 MG tablet Take 80 mg by mouth daily.  . famotidine (PEPCID) 20 MG tablet Take 20 mg by mouth at bedtime.  . Ferrous Sulfate (FEROSUL PO) Take 325 mg by mouth 2 (two) times daily.  . furosemide (LASIX) 20 MG tablet Take 20 mg by mouth daily.  . metoprolol tartrate (LOPRESSOR) 25 MG tablet Take 25 mg by mouth 2 (two) times  daily.  Marland Kitchen omeprazole (PRILOSEC) 40 MG capsule Take 40 mg by mouth daily.  . tamsulosin (FLOMAX) 0.4 MG CAPS capsule Take 0.4 mg by mouth daily.  . traZODone (DESYREL) 100 MG tablet Take 100 mg by mouth at bedtime.  Marland Kitchen POTASSIUM CHLORIDE ER PO Take 20 mEq by mouth.  . [DISCONTINUED] benazepril (LOTENSIN) 40 MG tablet Take 40 mg by mouth daily.  . [DISCONTINUED] ranitidine (ZANTAC) 150 MG capsule Take 150 mg by mouth 2 (two) times daily.   No facility-administered encounter medications on file as of 12/17/2019.     Review of Systems  Constitutional: Negative for appetite change, chills, fatigue and fever.  HENT: Negative for congestion, rhinorrhea, sinus pressure, sinus pain, sneezing and sore throat.   Eyes: Negative for discharge, redness and itching.  Respiratory: Negative for cough, chest tightness, shortness of breath and wheezing.   Cardiovascular: Positive for leg swelling. Negative for chest pain and palpitations.  Gastrointestinal: Negative for abdominal distention, abdominal pain, constipation, diarrhea, nausea and vomiting.  Endocrine: Negative for cold intolerance, heat intolerance, polydipsia, polyphagia and polyuria.  Genitourinary: Negative for difficulty urinating, dysuria, flank pain, frequency and urgency.  Musculoskeletal: Positive for gait problem. Negative for joint swelling and myalgias.  Skin: Negative for color change, pallor and rash.       Right heel blister   Neurological: Negative for dizziness, weakness, light-headedness and headaches.       Hx of dysarthria   Hematological: Does not bruise/bleed easily.  Psychiatric/Behavioral: Positive for confusion. Negative for agitation, behavioral problems and sleep disturbance. The patient is not nervous/anxious.     Vitals:   12/17/19 0927  BP: 125/63  Pulse: 85  Resp: 17  Temp: 97.8 F (36.6 C)  SpO2: 96%  Weight: 169 lb (76.7 kg)  Height:  (1.651 m)   Body mass index is 28.12 kg/m. Physical  Exam Vitals reviewed.  Constitutional:      General: She is not in acute distress.    Appearance: She is overweight.  HENT:     Head: Normocephalic.     Nose: Nose normal. No congestion or rhinorrhea.     Mouth/Throat:     Mouth: Mucous membranes are moist.     Pharynx: Oropharynx is clear. No oropharyngeal exudate or posterior oropharyngeal erythema.  Eyes:     General: No scleral icterus.       Right eye: No discharge.        Left eye: No discharge.     Conjunctiva/sclera: Conjunctivae normal.     Pupils: Pupils are equal, round, and reactive to light.  Cardiovascular:     Rate and Rhythm: Normal rate and regular rhythm.     Pulses: Normal pulses.     Heart sounds: Normal heart sounds. No murmur heard.  No friction rub. No gallop.   Pulmonary:     Effort: Pulmonary effort is normal. No respiratory distress.     Breath sounds: Normal breath sounds. No wheezing, rhonchi or rales.  Chest:     Chest wall: No tenderness.  Abdominal:     General: Bowel sounds are normal. There is no distension.     Palpations: Abdomen is soft. There is no mass.     Tenderness: There is no abdominal tenderness. There is no right CVA tenderness, left CVA tenderness, guarding or rebound.  Musculoskeletal:        General: No swelling or tenderness.     Cervical back: Normal range of motion. No rigidity or tenderness.     Right lower leg: Edema present.     Left lower leg: Edema present.     Comments: Unsteady gait  Lymphadenopathy:     Cervical: No cervical adenopathy.  Skin:    General: Skin is warm and dry.     Coloration: Skin is not pale.     Findings: No bruising, erythema or rash.     Comments: Right heel dark color blood blister.surrounding skin tissue without any sign of infection.Foam dressing in place no drainage noted.   Neurological:     Mental Status: She is alert. Mental status is at baseline.     Cranial Nerves: No cranial nerve deficit.     Motor: No weakness.     Coordination:  Coordination normal.     Gait: Gait abnormal.  Psychiatric:        Mood and Affect: Mood normal.        Behavior: Behavior is cooperative.        Thought Content: Thought content normal.        Cognition and Memory: Memory is impaired.     Comments: dysarthria     Labs reviewed: Basic Metabolic Panel: Recent Labs    11/15/19 0000 11/28/19 0000  NA 136* 137  K 4.0 4.4  CL 105 104  CO2 28* 21  BUN 10 10  CREATININE 0.6 0.7  CALCIUM 8.7 9.0   Liver Function Tests: Recent Labs    11/15/19 0000  AST 15  ALT 10  ALBUMIN 2.6*   CBC: Recent Labs    11/15/19 0000 11/28/19 0000  WBC 8.2 6.8  HGB 9.9* 9.5*  HCT 28* 29*  PLT 288 292    Procedures and Imaging Studies During Stay: No results found.  Assessment/Plan:     1. Unsteady gait Has worked well with PT/ OT. She will discharge home PT/OT to continue with ROM, Exercise, Gait stability and muscle strengthening. No new DME required has per POA has own Hospital bed,Wheelchair,Bed side commode and a walker at home.Fall and safety precautions.  2. Urinary retention Status post hospital admission 11/15/2019 - 11/21/2019 for recurrent urinary retention and urinary tract infection.Foley catheter placed during hospitalization.treated with I.V Cipro and discharged on Keflex.took off her foley catheter prior to visit has been voiding without any difficulties. -  continue to monitor and encourage fluid intake.  - HHN to monitor for urine retention - continue on Flomax 0.4 mg capsule daily - continue to follow up with Urologist - tamsulosin (FLOMAX) 0.4 MG CAPS capsule; Take 1 capsule (0.4 mg total) by mouth daily.  Dispense: 30 capsule; Refill: 0  3. Recurrent cystitis Status I.V Cipro and Keflex as above.  - CBC/diff in 1-2 weeks with PCP   4. Stage 3a chronic kidney disease CR at baseline.continue to avoid nephrotoxins and dose other medication for renal clearance.  BMP in 1-2 weeks with PCP   5. Anemia due to stage 3a  chronic kidney disease Hgb stable. - continue on Ferrous sulfate 325 mg tablet twice daily   CBC/diff in 1-2 weeks with PCP   6. PAD (peripheral artery disease) (HCC) - right heel blister  - continue with wound care .HHN for wound care management.  - continue on atorvastatin 80 mg tablet daily.Not on anticoagulant due to hx of subdural Hematoma.  - atorvastatin (LIPITOR) 80 MG tablet; Take 1 tablet (80 mg total) by mouth daily.  Dispense: 30 tablet; Refill: 0  7. Benign essential HTN B/p at goal. - continue on Amlodipine 10 mg tablet  Daily and Metoprolol tartrate 25 mg tablet twice  Daily. - amLODipine (NORVASC) 10 MG tablet; Take 1 tablet (10 mg total) by mouth daily.  Dispense: 30 tablet; Refill: 0 - metoprolol tartrate (LOPRESSOR) 25 MG tablet; Take 1 tablet (25 mg total) by mouth 2 (two) times daily.  Dispense: 60 tablet; Refill: 0  8. Vascular dementia with behavior disturbance (HCC) No new behavioral issues reported. Continue with supportive care.  9. Pseudogout Current meds effective. - allopurinol (ZYLOPRIM) 100 MG tablet; Take 1 tablet (100 mg total) by mouth daily.  Dispense: 30 tablet; Refill: 0  10. Hx of fall History of falls.has had fall x 2 while here at Rehab no injuries sustained.  11. Gastroesophageal reflux disease without esophagitis Hgb stable.  - continue on Omeprazole 40 mg capsule and famotidine 20 mg tablet at bedtime.  - CBC/diff with PCP in 1-2 weeks  13. Subdural hematoma (HCC) Not on anticoagulant due to Hx of Subdural hematoma.    Patient is being discharged with the following home health services:   -PT/OT for ROM, exercise, gait stability and muscle strengthening  -  HH RN for right heel  wound care management    Patient is being discharged with the following durable medical equipment:    None. Has own Hospital bed,wheelchair,Bedside commode and walker at home.  Patient has been advised to f/u with their PCP in 1-2 weeks to for a  transitions of care visit.Social services at their facility was responsible for arranging this appointment.  Pt was provided with adequate prescriptions of noncontrolled medications to reach the scheduled appointment.For controlled substances, a limited supply was provided as appropriate for the individual patient. If the pt normally receives these medications from a pain clinic or has a contract with another physician, these medications should be received from that clinic or physician only).    Future labs/tests needed:  CBC, BMP in 1-2 weeks PCP

## 2019-12-29 MED ORDER — FUROSEMIDE 20 MG PO TABS: 20.0000 mg | ORAL_TABLET | Freq: Every day | ORAL | 0 refills | Status: AC

## 2019-12-29 MED ORDER — METOPROLOL TARTRATE 25 MG PO TABS: 25.0000 mg | ORAL_TABLET | Freq: Two times a day (BID) | ORAL | 0 refills | Status: AC

## 2019-12-29 MED ORDER — AMITRIPTYLINE HCL 25 MG PO TABS: 25.0000 mg | ORAL_TABLET | Freq: Every day | ORAL | 0 refills | Status: AC

## 2019-12-29 MED ORDER — AMLODIPINE BESYLATE 10 MG PO TABS: 10.0000 mg | ORAL_TABLET | Freq: Every day | ORAL | 0 refills | Status: AC

## 2019-12-29 MED ORDER — FAMOTIDINE 20 MG PO TABS: 20.0000 mg | ORAL_TABLET | Freq: Every day | ORAL | 0 refills | Status: AC

## 2019-12-29 MED ORDER — TAMSULOSIN HCL 0.4 MG PO CAPS: 0.4000 mg | ORAL_CAPSULE | Freq: Every day | ORAL | 0 refills | Status: AC

## 2019-12-29 MED ORDER — OMEPRAZOLE 40 MG PO CPDR: 40.0000 mg | DELAYED_RELEASE_CAPSULE | Freq: Every day | ORAL | 0 refills | Status: AC

## 2019-12-29 MED ORDER — TRAZODONE HCL 100 MG PO TABS: 100.0000 mg | ORAL_TABLET | Freq: Every day | ORAL | 0 refills | Status: AC

## 2019-12-29 MED ORDER — ALLOPURINOL 100 MG PO TABS: 100.0000 mg | ORAL_TABLET | Freq: Every day | ORAL | 0 refills | Status: AC

## 2019-12-29 MED ORDER — ATORVASTATIN CALCIUM 80 MG PO TABS: 80.0000 mg | ORAL_TABLET | Freq: Every day | ORAL | 0 refills | Status: AC

## 2020-01-12 ENCOUNTER — Other Ambulatory Visit: Payer: Self-pay | Admitting: Family

## 2020-01-12 DIAGNOSIS — I10 Essential (primary) hypertension: Secondary | ICD-10-CM

## 2020-03-13 ENCOUNTER — Other Ambulatory Visit: Payer: Self-pay | Admitting: Family

## 2020-03-13 DIAGNOSIS — I10 Essential (primary) hypertension: Secondary | ICD-10-CM

## 2020-03-13 DIAGNOSIS — M1120 Other chondrocalcinosis, unspecified site: Secondary | ICD-10-CM

## 2023-09-06 DEATH — deceased
